# Patient Record
Sex: Female | Born: 1955 | Race: White | Hispanic: No | Marital: Married | Smoking: Never smoker
Health system: Southern US, Community
[De-identification: ages and names within clinical notes are randomized; demographics above are authoritative.]

## PROBLEM LIST (undated history)

## (undated) DIAGNOSIS — N840 Polyp of corpus uteri: Secondary | ICD-10-CM

## (undated) DIAGNOSIS — M1990 Unspecified osteoarthritis, unspecified site: Secondary | ICD-10-CM

## (undated) DIAGNOSIS — N649 Disorder of breast, unspecified: Secondary | ICD-10-CM

## (undated) DIAGNOSIS — D259 Leiomyoma of uterus, unspecified: Secondary | ICD-10-CM

## (undated) DIAGNOSIS — K219 Gastro-esophageal reflux disease without esophagitis: Secondary | ICD-10-CM

## (undated) HISTORY — PX: OTHER SURGICAL HISTORY: SHX169

## (undated) HISTORY — DX: Unspecified osteoarthritis, unspecified site: M19.90

## (undated) HISTORY — DX: Polyp of corpus uteri: N84.0

## (undated) HISTORY — PX: COLONOSCOPY: SHX174

## (undated) HISTORY — DX: Disorder of breast, unspecified: N64.9

## (undated) HISTORY — DX: Gastro-esophageal reflux disease without esophagitis: K21.9

## (undated) HISTORY — PX: HYSTEROSCOPY: SHX211

## (undated) HISTORY — PX: BREAST BIOPSY: SHX20

## (undated) HISTORY — DX: Leiomyoma of uterus, unspecified: D25.9

## (undated) HISTORY — PX: BREAST EXCISIONAL BIOPSY: SUR124

---

## 1991-03-08 HISTORY — PX: BREAST EXCISIONAL BIOPSY: SUR124

## 1996-03-07 HISTORY — PX: BREAST EXCISIONAL BIOPSY: SUR124

## 2003-03-08 HISTORY — PX: BREAST EXCISIONAL BIOPSY: SUR124

## 2014-03-17 ENCOUNTER — Other Ambulatory Visit: Payer: Self-pay | Admitting: Obstetrics & Gynecology

## 2014-03-17 DIAGNOSIS — Z1231 Encounter for screening mammogram for malignant neoplasm of breast: Secondary | ICD-10-CM

## 2014-04-02 ENCOUNTER — Ambulatory Visit (INDEPENDENT_AMBULATORY_CARE_PROVIDER_SITE_OTHER): Payer: BC Managed Care – PPO

## 2014-04-02 DIAGNOSIS — Z1231 Encounter for screening mammogram for malignant neoplasm of breast: Secondary | ICD-10-CM

## 2014-06-24 ENCOUNTER — Ambulatory Visit (INDEPENDENT_AMBULATORY_CARE_PROVIDER_SITE_OTHER): Payer: BC Managed Care – PPO | Admitting: Physician Assistant

## 2014-06-24 ENCOUNTER — Encounter: Payer: Self-pay | Admitting: Physician Assistant

## 2014-06-24 VITALS — BP 123/86 | HR 74 | Ht 64.0 in | Wt 135.0 lb

## 2014-06-24 DIAGNOSIS — Z23 Encounter for immunization: Secondary | ICD-10-CM

## 2014-06-24 DIAGNOSIS — R202 Paresthesia of skin: Secondary | ICD-10-CM

## 2014-06-24 DIAGNOSIS — D241 Benign neoplasm of right breast: Secondary | ICD-10-CM

## 2014-06-24 DIAGNOSIS — Z13220 Encounter for screening for lipoid disorders: Secondary | ICD-10-CM

## 2014-06-24 DIAGNOSIS — R928 Other abnormal and inconclusive findings on diagnostic imaging of breast: Secondary | ICD-10-CM | POA: Diagnosis not present

## 2014-06-24 DIAGNOSIS — Z131 Encounter for screening for diabetes mellitus: Secondary | ICD-10-CM

## 2014-06-24 DIAGNOSIS — Z86010 Personal history of colonic polyps: Secondary | ICD-10-CM

## 2014-06-24 DIAGNOSIS — D242 Benign neoplasm of left breast: Secondary | ICD-10-CM

## 2014-06-24 DIAGNOSIS — R200 Anesthesia of skin: Secondary | ICD-10-CM | POA: Insufficient documentation

## 2014-06-24 MED ORDER — TETANUS-DIPHTH-ACELL PERTUSSIS 5-2.5-18.5 LF-MCG/0.5 IM SUSP: 0.5000 mL | Freq: Once | INTRAMUSCULAR | Status: DC

## 2014-06-24 MED ORDER — DICLOFENAC SODIUM 2 % TD SOLN: TRANSDERMAL | Status: DC

## 2014-06-24 MED ORDER — VARICELLA VIRUS VACCINE LIVE 1350 PFU/0.5ML IJ SUSR: 0.5000 mL | Freq: Once | INTRAMUSCULAR | Status: DC

## 2014-07-01 DIAGNOSIS — D241 Benign neoplasm of right breast: Secondary | ICD-10-CM | POA: Insufficient documentation

## 2014-07-01 DIAGNOSIS — D242 Benign neoplasm of left breast: Secondary | ICD-10-CM

## 2014-07-01 NOTE — Progress Notes (Signed)
   Subjective:    Patient ID: Katie Walker, female    DOB: 02-23-1956, 59 y.o.   MRN: [id number]  HPI Patient is a 59 year old female who presents to the clinic to establish care.  .. Active Ambulatory Problems    Diagnosis Date Noted  . Abnormal mammogram of both breasts 06/24/2014  . History of colonic polyps 06/24/2014  . Numbness and tingling sensation of skin 06/24/2014  . 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 No Resolved Ambulatory Problems   No Additional Past Medical History   .[name] Family History  Problem Relation Age of Onset  . Thyroid disease Sister   . Breast cancer Paternal Grandmother    .[name] History   Social History  . Marital Status: Married    Spouse Name: Ray "Tax inspector  . Number of Children: 2  . Years of Education: Bachelor's   Occupational History  . Not on file.   Social History Main Topics  . Smoking status: Never Smoker   . Smokeless tobacco: Not on file  . Alcohol Use: No  . Drug Use: No  . Sexual Activity: Yes    Birth Control/ Protection: None     Comment: Partner: Male   Other Topics Concern  . Not on file   Social History Narrative      Review of Systems  All other systems reviewed and are negative.      Objective:   Physical Exam  Constitutional: She is oriented to person, place, and time. She appears well-developed and well-nourished.  HENT:  Head: Normocephalic and atraumatic.  Cardiovascular: Normal rate, regular rhythm and normal heart sounds.   Pulmonary/Chest: Effort normal and breath sounds normal.  Musculoskeletal:  NROM of right hip.  No tenderness over right inguinal area.  Strength of right lower extremity 5/5.   Neurological: She is alert and oriented to person, place, and time.  Skin: Skin is dry.  Psychiatric: She has a normal mood and affect. Her behavior is normal.          Assessment & Plan:  Patient was given tetanus shot and  Zostavax today. Patient's mammogram is up-to-date as well as colonoscopy.  Fasting labs were given for patient to have drawn such as lipid panel and CMP.  Patient is having some numbness and tingling in her right growing. Did give samples of pin said to try. Since she is not having any pain or decreased range of motion will not do any further workup at this time. If any of these symptoms change please follow-up.

## 2014-07-11 ENCOUNTER — Encounter: Payer: Self-pay | Admitting: Family Medicine

## 2014-07-11 ENCOUNTER — Ambulatory Visit (INDEPENDENT_AMBULATORY_CARE_PROVIDER_SITE_OTHER): Payer: BC Managed Care – PPO | Admitting: Family Medicine

## 2014-07-11 VITALS — BP 126/77 | HR 74 | Temp 98.3°F | Wt 136.0 lb

## 2014-07-11 DIAGNOSIS — R300 Dysuria: Secondary | ICD-10-CM | POA: Diagnosis not present

## 2014-07-11 LAB — POCT URINALYSIS DIPSTICK
Bilirubin, UA: NEGATIVE
GLUCOSE UA: NEGATIVE
Ketones, UA: NEGATIVE
Nitrite, UA: NEGATIVE
Spec Grav, UA: 1.025
UROBILINOGEN UA: 0.2
pH, UA: 6

## 2014-07-11 MED ORDER — CIPROFLOXACIN HCL 250 MG PO TABS: ORAL_TABLET | ORAL | Status: AC

## 2014-07-11 NOTE — Progress Notes (Signed)
CC: Katie Walker is a 59 y.o. female is here for Urinary Tract Infection   Subjective: HPI:   sensation of incomplete voiding, dysuria, urinary hesitancy that has been present for the past 3 or 4 days on a daily basis. Seems to be worse at night. Severe in severity at night but absent during the day. Accompanied by gross blood in urine last night. Interventions have included increasing fluid intake. Subjective fevers but no chills or night sweats. Denies flank pain but has had suprapubic pain. No confusion, flank pain, nor abdominal pain elsewhere   Review Of Systems Outlined In HPI  No past medical history on file.  No past surgical history on file. Family History  Problem Relation Age of Onset  . Thyroid disease Sister   . Breast cancer Paternal Grandmother     History   Social History  . Marital Status: Married    Spouse Name: Katie Walker "Tax inspector  . Number of Children: 2  . Years of Education: Bachelor's   Occupational History  . Not on file.   Social History Main Topics  . Smoking status: Never Smoker   . Smokeless tobacco: Not on file  . Alcohol Use: No  . Drug Use: No  . Sexual Activity: Yes    Birth Control/ Protection: None     Comment: Partner: Male   Other Topics Concern  . Not on file   Social History Narrative     Objective: BP 126/77 mmHg  Pulse 74  Temp(Src) 98.3 F (36.8 C) (Oral)  Wt 136 lb (61.689 kg)  General: Alert and Oriented, No Acute Distress HEENT: Pupils equal, round, reactive to light. Conjunctivae clear.   Lungs: clear comfortable work of breathing Cardiac: Regular rate and rhythm.  Abdomen: Normal bowel sounds, soft and non tender without palpable masses. Extremities: No peripheral edema.  Strong peripheral pulses.  Mental Status: No depression, anxiety, nor agitation. Skin: Warm and dry.  Assessment & Plan: [name] was seen today for urinary tract infection.  Diagnoses and all orders for this visit:  Dysuria Orders: -      Urinalysis Dipstick -     Urine Culture -     ciprofloxacin (CIPRO) 250 MG tablet; Take one by mouth twice a day for five days.   Dysuria with symptoms suggestive of UTI and suspicious UA therefore start Cipro will follow culture.  Return if symptoms worsen or fail to improve.

## 2014-07-12 LAB — URINE CULTURE: Colony Count: 6000

## 2014-08-11 ENCOUNTER — Ambulatory Visit (INDEPENDENT_AMBULATORY_CARE_PROVIDER_SITE_OTHER): Payer: BC Managed Care – PPO | Admitting: Family Medicine

## 2014-08-11 ENCOUNTER — Encounter: Payer: Self-pay | Admitting: Family Medicine

## 2014-08-11 VITALS — BP 138/86 | HR 75 | Temp 98.4°F | Wt 136.0 lb

## 2014-08-11 DIAGNOSIS — J329 Chronic sinusitis, unspecified: Secondary | ICD-10-CM | POA: Diagnosis not present

## 2014-08-11 DIAGNOSIS — A499 Bacterial infection, unspecified: Secondary | ICD-10-CM | POA: Diagnosis not present

## 2014-08-11 DIAGNOSIS — B9689 Other specified bacterial agents as the cause of diseases classified elsewhere: Secondary | ICD-10-CM

## 2014-08-11 MED ORDER — CEFDINIR 300 MG PO CAPS: 300.0000 mg | ORAL_CAPSULE | Freq: Two times a day (BID) | ORAL | Status: AC

## 2014-08-11 MED ORDER — METHYLPREDNISOLONE ACETATE 80 MG/ML IJ SUSP
80.0000 mg | Freq: Once | INTRAMUSCULAR | Status: AC
  Administered 2014-08-11: 80 mg via INTRAMUSCULAR

## 2014-08-11 NOTE — Addendum Note (Signed)
Addended by: Terance Hart on: 08/11/2014 05:05 PM   Modules accepted: Orders

## 2014-08-11 NOTE — Progress Notes (Signed)
CC: Katie Walker is a 59 y.o. female is here for Sinusitis?   Subjective: HPI:  Nasal congestion, left cheek pain, postnasal drip and sore throat that has been present for the last 10 days. She has tried subsequent sinus, Mucinex, NyQuil, cough drops all of which have been mildly affected. Symptoms are moderate in severity and slowly worsening. Accompanied by subjective fever. Denies shortness of breath, wheezing, chest pain, headache, rash, confusion. Symptoms are present all hours of the day   Review Of Systems Outlined In HPI  No past medical history on file.  No past surgical history on file. Family History  Problem Relation Age of Onset  . Thyroid disease Sister   . Breast cancer Paternal Grandmother     History   Social History  . Marital Status: Married    Spouse Name: Katie Walker "Tax inspector  . Number of Children: 2  . Years of Education: Bachelor's   Occupational History  . Not on file.   Social History Main Topics  . Smoking status: Never Smoker   . Smokeless tobacco: Not on file  . Alcohol Use: No  . Drug Use: No  . Sexual Activity: Yes    Birth Control/ Protection: None     Comment: Partner: Male   Other Topics Concern  . Not on file   Social History Narrative     Objective: BP 138/86 mmHg  Pulse 75  Temp(Src) 98.4 F (36.9 C) (Oral)  Wt 136 lb (61.689 kg)  General: Alert and Oriented, No Acute Distress HEENT: Pupils equal, round, reactive to light. Conjunctivae clear.  External ears unremarkable, canals clear with intact TMs with appropriate landmarks. right Middle ear appears open without effusion, left middle ear has a purulent effusion. Pink inferior turbinates.  Moist mucous membranes, pharynx without inflammation nor lesions.  Neck supple without palpable lymphadenopathy nor abnormal masses.left maxillary sinus tenderness Lungs: Clear to auscultation bilaterally, no wheezing/ronchi/rales.  Comfortable work of breathing. Good air  movement. Extremities: No peripheral edema.  Strong peripheral pulses.  Mental Status: No depression, anxiety, nor agitation. Skin: Warm and dry.  Assessment & Plan: Katie Walker was seen today for sinusitis?.  Diagnoses and all orders for this visit:  Bacterial sinusitis Orders: -     cefdinir (OMNICEF) 300 MG capsule; Take 1 capsule (300 mg total) by mouth 2 (two) times daily.   Bacterial sinusitis: Start Omnicef, she is requesting a injection of "anything that'll help me feel better faster" therefore she'll receive Depo-Medrol since all over-the-counter interventions have been overall ineffective.  Return if symptoms worsen or fail to improve.

## 2014-08-12 ENCOUNTER — Ambulatory Visit: Payer: BC Managed Care – PPO | Admitting: Sports Medicine

## 2014-11-12 ENCOUNTER — Ambulatory Visit (INDEPENDENT_AMBULATORY_CARE_PROVIDER_SITE_OTHER): Payer: BC Managed Care – PPO

## 2014-11-12 ENCOUNTER — Encounter: Payer: Self-pay | Admitting: Obstetrics & Gynecology

## 2014-11-12 ENCOUNTER — Ambulatory Visit (INDEPENDENT_AMBULATORY_CARE_PROVIDER_SITE_OTHER): Payer: BC Managed Care – PPO | Admitting: Obstetrics & Gynecology

## 2014-11-12 VITALS — BP 131/72 | HR 60 | Resp 16 | Ht 64.0 in | Wt 137.0 lb

## 2014-11-12 DIAGNOSIS — Z124 Encounter for screening for malignant neoplasm of cervix: Secondary | ICD-10-CM | POA: Diagnosis not present

## 2014-11-12 DIAGNOSIS — Z01419 Encounter for gynecological examination (general) (routine) without abnormal findings: Secondary | ICD-10-CM

## 2014-11-12 DIAGNOSIS — D259 Leiomyoma of uterus, unspecified: Secondary | ICD-10-CM

## 2014-11-12 DIAGNOSIS — Z0000 Encounter for general adult medical examination without abnormal findings: Secondary | ICD-10-CM

## 2014-11-12 DIAGNOSIS — Z23 Encounter for immunization: Secondary | ICD-10-CM

## 2014-11-12 DIAGNOSIS — Z1151 Encounter for screening for human papillomavirus (HPV): Secondary | ICD-10-CM

## 2014-11-12 LAB — CBC
HCT: 40.4 % (ref 36.0–46.0)
Hemoglobin: 13.1 g/dL (ref 12.0–15.0)
MCH: 28.1 pg (ref 26.0–34.0)
MCHC: 32.4 g/dL (ref 30.0–36.0)
MCV: 86.5 fL (ref 78.0–100.0)
MPV: 9.9 fL (ref 8.6–12.4)
PLATELETS: 209 10*3/uL (ref 150–400)
RBC: 4.67 MIL/uL (ref 3.87–5.11)
RDW: 14.8 % (ref 11.5–15.5)
WBC: 4.7 10*3/uL (ref 4.0–10.5)

## 2014-11-12 LAB — LIPID PANEL
CHOL/HDL RATIO: 2.7 ratio (ref <=5.0)
Cholesterol: 228 mg/dL — ABNORMAL HIGH (ref 125–200)
HDL: 84 mg/dL (ref >=46)
LDL Cholesterol: 122 mg/dL (ref <130)
Triglycerides: 108 mg/dL (ref <150)
VLDL: 22 mg/dL (ref <30)

## 2014-11-12 LAB — COMPREHENSIVE METABOLIC PANEL
ALT: 15 U/L (ref 6–29)
AST: 17 U/L (ref 10–35)
Albumin: 4.5 g/dL (ref 3.6–5.1)
Alkaline Phosphatase: 73 U/L (ref 33–130)
BUN: 25 mg/dL (ref 7–25)
CHLORIDE: 100 mmol/L (ref 98–110)
CO2: 29 mmol/L (ref 20–31)
CREATININE: 0.75 mg/dL (ref 0.50–0.99)
Calcium: 9.4 mg/dL (ref 8.6–10.4)
Glucose, Bld: 83 mg/dL (ref 65–99)
Potassium: 4.1 mmol/L (ref 3.5–5.3)
Sodium: 138 mmol/L (ref 135–146)
Total Bilirubin: 0.5 mg/dL (ref 0.2–1.2)
Total Protein: 6.9 g/dL (ref 6.1–8.1)

## 2014-11-12 LAB — TSH: TSH: 1.583 u[IU]/mL (ref 0.350–4.500)

## 2014-11-12 MED ORDER — TRETINOIN 0.05 % EX CREA: TOPICAL_CREAM | Freq: Every day | CUTANEOUS | Status: DC

## 2014-11-12 NOTE — Progress Notes (Signed)
Subjective:    Katie Walker is a 59 y.o. MW G2 (53 and 51 yo daughters, 2 1/2 grands) female who presents for an annual exam. The patient has no complaints today. She would fasting labs.  The patient is sexually active. GYN screening history: last pap: was normal. The patient wears seatbelts: yes. The patient participates in regular exercise: yes. Has the patient ever been transfused or tattooed?: no. The patient reports that there is not domestic violence in her life.   Menstrual History: OB History    Gravida Para Term Preterm AB TAB SAB Ectopic Multiple Living   2 2 2       2       Menarche age: 42  No LMP recorded. Patient is postmenopausal.    The following portions of the patient's history were reviewed and updated as appropriate: allergies, current medications, past family history, past medical history, past social history, past surgical history and problem list.  Review of Systems A comprehensive review of systems was negative.  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 Colonoscopy UTD (on 5 year cycle).   Objective:    BP 131/72 mmHg  Pulse 60  Resp 16  Ht 5\' 4"  (1.626 m)  Wt 137 lb (62.143 kg)  BMI 23.50 kg/m2  General Appearance:    Alert, cooperative, no distress, appears stated age  Head:    Normocephalic, without obvious abnormality, atraumatic  Eyes:    PERRL, conjunctiva/corneas clear, EOM's intact, fundi    benign, both eyes  Ears:    Normal TM's and external ear canals, both ears  Nose:   Nares normal, septum midline, mucosa normal, no drainage    or sinus tenderness  Throat:   Lips, mucosa, and tongue normal; teeth and gums normal  Neck:   Supple, symmetrical, trachea midline, no adenopathy;    thyroid:  no enlargement/tenderness/nodules; no carotid   bruit or JVD  Back:     Symmetric, no curvature, ROM normal, no CVA tenderness  Lungs:     Clear to auscultation bilaterally, respirations unlabored  Chest Wall:    No tenderness or  deformity   Heart:    Regular rate and rhythm, S1 and S2 normal, no murmur, rub   or gallop  Breast Exam:    No tenderness, masses, or nipple abnormality  Abdomen:     Soft, non-tender, bowel sounds active all four quadrants,    no masses, no organomegaly  Genitalia:    Normal female without lesion, discharge or tenderness, moderate atrophy, NSSA, NT, mobile, normal adnexal exam     Extremities:   Extremities normal, atraumatic, no cyanosis or edema  Pulses:   2+ and symmetric all extremities  Skin:   Skin color, texture, turgor normal, no rashes or lesions  Lymph nodes:   Cervical, supraclavicular, and axillary nodes normal  Neurologic:   CNII-XII intact, normal strength, sensation and reflexes    throughout  .    Assessment:    Healthy female exam.   Fibroids- She is requesting an u/s to follow up on these   Plan:     Breast self exam technique reviewed and patient encouraged to perform self-exam monthly. Thin prep Pap smear. fasting labs   Gyn u/s

## 2014-11-14 LAB — CYTOLOGY - PAP

## 2014-11-17 ENCOUNTER — Encounter: Payer: Self-pay | Admitting: Obstetrics & Gynecology

## 2014-11-17 ENCOUNTER — Encounter: Payer: Self-pay | Admitting: *Deleted

## 2014-11-17 ENCOUNTER — Telehealth: Payer: Self-pay | Admitting: *Deleted

## 2014-11-17 NOTE — Telephone Encounter (Signed)
Mailing Low fat diet

## 2014-11-17 NOTE — Telephone Encounter (Signed)
-----   Message from Emily Filbert, MD sent at 11/17/2014  9:36 AM EDT ----- Can you send her a low fat diet please. Thanks

## 2014-11-24 ENCOUNTER — Encounter: Payer: Self-pay | Admitting: Obstetrics & Gynecology

## 2015-01-20 ENCOUNTER — Encounter: Payer: Self-pay | Admitting: Physician Assistant

## 2015-01-20 ENCOUNTER — Ambulatory Visit (INDEPENDENT_AMBULATORY_CARE_PROVIDER_SITE_OTHER): Payer: BC Managed Care – PPO | Admitting: Physician Assistant

## 2015-01-20 VITALS — BP 135/69 | HR 72 | Ht 64.0 in | Wt 137.0 lb

## 2015-01-20 DIAGNOSIS — H109 Unspecified conjunctivitis: Secondary | ICD-10-CM | POA: Diagnosis not present

## 2015-01-20 MED ORDER — POLYMYXIN B-TRIMETHOPRIM 10000-0.1 UNIT/ML-% OP SOLN: 1.0000 [drp] | Freq: Four times a day (QID) | OPHTHALMIC | Status: DC

## 2015-01-20 NOTE — Patient Instructions (Signed)

## 2015-01-20 NOTE — Progress Notes (Signed)
   Subjective:    Patient ID: Katie Walker, female    DOB: [birth year], 59 y.o.   MRN: [id number]  HPI Pt presents to the clinic with a red, tender right eye with watery discharge for 3 days. She denies any fever, chills, sinus pressure, ST or ear pain. No injury to eye. She has been in and out of hospital due to her new grand baby. She has tried warm compresses but redness and discharge keeps worsening.    Review of Systems  All other systems reviewed and are negative.      Objective:   Physical Exam  Constitutional: She appears well-developed and well-nourished.  HENT:  Head: Normocephalic and atraumatic.  Right Ear: External ear normal.  Left Ear: External ear normal.  Nose: Nose normal.  Mouth/Throat: Oropharynx is clear and moist. No oropharyngeal exudate.  TM's clear bilaterally.   Eyes: Right eye exhibits discharge. Left eye exhibits no discharge.  Right conjunctiva injected with watery and thick greenish discharge. Erythema around eyelids and lashes.   Neck: Normal range of motion. Neck supple.  Cardiovascular: Normal rate, regular rhythm and normal heart sounds.   Pulmonary/Chest: Effort normal and breath sounds normal. She has no wheezes.  Lymphadenopathy:    She has no cervical adenopathy.  Psychiatric: She has a normal mood and affect. Her behavior is normal.          Assessment & Plan:  Bacterial conjunctivitis, right eye- treated with polytrim for 7 days. Continue warm compresses. Ibuprofen for pain. Discussed hand washes and getting rid of mascara. HO given. Follow up as needed.

## 2015-02-24 ENCOUNTER — Other Ambulatory Visit (HOSPITAL_COMMUNITY): Payer: Self-pay | Admitting: Obstetrics & Gynecology

## 2015-02-24 DIAGNOSIS — Z1231 Encounter for screening mammogram for malignant neoplasm of breast: Secondary | ICD-10-CM

## 2015-04-08 ENCOUNTER — Ambulatory Visit: Payer: BC Managed Care – PPO

## 2015-04-15 ENCOUNTER — Ambulatory Visit (INDEPENDENT_AMBULATORY_CARE_PROVIDER_SITE_OTHER): Payer: BC Managed Care – PPO

## 2015-04-15 DIAGNOSIS — Z1231 Encounter for screening mammogram for malignant neoplasm of breast: Secondary | ICD-10-CM | POA: Diagnosis not present

## 2015-11-16 ENCOUNTER — Telehealth: Payer: Self-pay | Admitting: Physician Assistant

## 2015-11-16 DIAGNOSIS — Z0000 Encounter for general adult medical examination without abnormal findings: Secondary | ICD-10-CM

## 2015-11-16 DIAGNOSIS — Z13220 Encounter for screening for lipoid disorders: Secondary | ICD-10-CM

## 2015-11-16 NOTE — Telephone Encounter (Signed)
Labs ordered. Pt notified.

## 2015-11-16 NOTE — Telephone Encounter (Signed)
Patient called and asked that you please send her lab orders downstairs for her upcoming physical next week so she can come in and have the blood work drawn. - CF

## 2015-11-18 LAB — COMPLETE METABOLIC PANEL WITH GFR
ALT: 15 U/L (ref 6–29)
AST: 17 U/L (ref 10–35)
Albumin: 4.4 g/dL (ref 3.6–5.1)
Alkaline Phosphatase: 71 U/L (ref 33–130)
BILIRUBIN TOTAL: 0.6 mg/dL (ref 0.2–1.2)
BUN: 18 mg/dL (ref 7–25)
CALCIUM: 9.8 mg/dL (ref 8.6–10.4)
CHLORIDE: 105 mmol/L (ref 98–110)
CO2: 31 mmol/L (ref 20–31)
CREATININE: 0.9 mg/dL (ref 0.50–0.99)
GFR, Est African American: 80 mL/min (ref >=60)
GFR, Est Non African American: 69 mL/min (ref >=60)
Glucose, Bld: 90 mg/dL (ref 65–99)
Potassium: 4.1 mmol/L (ref 3.5–5.3)
Sodium: 142 mmol/L (ref 135–146)
TOTAL PROTEIN: 6.8 g/dL (ref 6.1–8.1)

## 2015-11-18 LAB — LIPID PANEL
CHOLESTEROL: 215 mg/dL — AB (ref 125–200)
HDL: 80 mg/dL (ref >=46)
LDL CALC: 117 mg/dL (ref <130)
Total CHOL/HDL Ratio: 2.7 Ratio (ref <=5.0)
Triglycerides: 88 mg/dL (ref <150)
VLDL: 18 mg/dL (ref <30)

## 2015-11-23 ENCOUNTER — Ambulatory Visit (INDEPENDENT_AMBULATORY_CARE_PROVIDER_SITE_OTHER): Payer: BC Managed Care – PPO | Admitting: Physician Assistant

## 2015-11-23 ENCOUNTER — Encounter: Payer: Self-pay | Admitting: Physician Assistant

## 2015-11-23 ENCOUNTER — Other Ambulatory Visit (HOSPITAL_COMMUNITY)
Admission: RE | Admit: 2015-11-23 | Discharge: 2015-11-23 | Disposition: A | Discharge status: Home / Self Care | Payer: BC Managed Care – PPO | Source: Ambulatory Visit | Attending: Physician Assistant | Admitting: Physician Assistant

## 2015-11-23 VITALS — BP 131/77 | HR 67 | Ht 64.0 in | Wt 135.0 lb

## 2015-11-23 DIAGNOSIS — Z01419 Encounter for gynecological examination (general) (routine) without abnormal findings: Secondary | ICD-10-CM | POA: Insufficient documentation

## 2015-11-23 DIAGNOSIS — Z1151 Encounter for screening for human papillomavirus (HPV): Secondary | ICD-10-CM | POA: Diagnosis present

## 2015-11-23 DIAGNOSIS — Z0000 Encounter for general adult medical examination without abnormal findings: Secondary | ICD-10-CM | POA: Diagnosis not present

## 2015-11-23 DIAGNOSIS — G479 Sleep disorder, unspecified: Secondary | ICD-10-CM

## 2015-11-23 NOTE — Patient Instructions (Addendum)

## 2015-11-24 ENCOUNTER — Encounter: Payer: Self-pay | Admitting: Physician Assistant

## 2015-11-24 LAB — CYTOLOGY - PAP

## 2015-11-24 NOTE — Progress Notes (Signed)
Subjective:     Katie Walker is a 60 y.o. female and is here for a comprehensive physical exam. The patient reports problems - she continues to have some problems staying asleep. she does take melatonin at bedtime but does not help her stay asleep but helps her get to sleep. .  Social History   Social History  . Marital status: Married    Spouse name: Ray "Tax inspector  . Number of children: 2  . Years of education: Bachelor's   Occupational History  . homemaker    Social History Main Topics  . Smoking status: Never Smoker  . Smokeless tobacco: Never Used  . Alcohol use No  . Drug use: No  . Sexual activity: Yes    Partners: Male    Birth control/ protection: None     Comment: Partner: Male   Other Topics Concern  . Not on file   Social History Narrative  . No narrative on file   Health Maintenance  Topic Date Due  . INFLUENZA VACCINE  11/22/2016 (Originally 10/06/2015)  . Hepatitis C Screening  11/22/2016 (Originally [birth year])  . HIV Screening  11/22/2025 (Originally 06/14/1969)  . MAMMOGRAM  04/14/2017  . PAP SMEAR  11/11/2017  . COLONOSCOPY  08/21/2018  . TETANUS/TDAP  11/11/2024  . ZOSTAVAX  Completed    The following portions of the patient's history were reviewed and updated as appropriate: allergies, current medications, past family history, past medical history, past social history, past surgical history and problem list.  Review of Systems Pertinent items noted in HPI and remainder of comprehensive ROS otherwise negative.   Objective:    BP 131/77   Pulse 67   Ht 5\' 4"  (1.626 m)   Wt 135 lb (61.2 kg)   BMI 23.17 kg/m  General appearance: alert, cooperative and appears stated age Head: Normocephalic, without obvious abnormality, atraumatic Eyes: conjunctivae/corneas clear. PERRL, EOM's intact. Fundi benign. Ears: normal TM's and external ear canals both ears Nose: Nares normal. Septum midline. Mucosa normal. No drainage or sinus  tenderness. Throat: lips, mucosa, and tongue normal; teeth and gums normal Neck: no adenopathy, no carotid bruit, no JVD, supple, symmetrical, trachea midline and thyroid not enlarged, symmetric, no tenderness/mass/nodules Back: symmetric, no curvature. ROM normal. No CVA tenderness. Lungs: clear to auscultation bilaterally Breasts: normal appearance, no masses or tenderness Heart: regular rate and rhythm, S1, S2 normal, no murmur, click, rub or gallop Abdomen: soft, non-tender; bowel sounds normal; no masses,  no organomegaly Pelvic: cervix normal in appearance, external genitalia normal, no adnexal masses or tenderness, no cervical motion tenderness, rectovaginal septum normal, uterus normal size, shape, and consistency and vagina normal without discharge Extremities: extremities normal, atraumatic, no cyanosis or edema Pulses: 2+ and symmetric Skin: Skin color, texture, turgor normal. No rashes or lesions Lymph nodes: Cervical, supraclavicular, and axillary nodes normal. Neurologic: Alert and oriented X 3, normal strength and tone. Normal symmetric reflexes. Normal coordination and gait    Assessment:    Healthy female exam.      Plan:  CPE- discussed labs with patient. Mammogram up to date. Colonoscopy up to date. Flu shot given today without complications. Discussed valarian root for sleep with melatonin. Discussed vitamin D 800 units and calcium 1500mg  with 150 minutes of exercise a week.  See After Visit Summary for Counseling Recommendations

## 2016-03-15 ENCOUNTER — Other Ambulatory Visit (HOSPITAL_COMMUNITY): Payer: Self-pay | Admitting: Obstetrics & Gynecology

## 2016-03-15 DIAGNOSIS — Z9289 Personal history of other medical treatment: Secondary | ICD-10-CM

## 2016-03-21 ENCOUNTER — Telehealth: Payer: Self-pay | Admitting: *Deleted

## 2016-03-21 NOTE — Telephone Encounter (Signed)
[facility] for tamiflu 75mg  1 po qd for 10 days. #10 NRF

## 2016-03-21 NOTE — Telephone Encounter (Signed)
Pt's granddaughter that she keeps daily has tested positive for the flu & pt would like prophylactic Tamiflu called in for her.

## 2016-03-22 ENCOUNTER — Other Ambulatory Visit: Payer: Self-pay | Admitting: *Deleted

## 2016-03-22 MED ORDER — OSELTAMIVIR PHOSPHATE 75 MG PO CAPS: 75.0000 mg | ORAL_CAPSULE | Freq: Every day | ORAL | 0 refills | Status: DC

## 2016-03-22 NOTE — Telephone Encounter (Signed)
Pt notified of rx. 

## 2016-04-19 ENCOUNTER — Ambulatory Visit (INDEPENDENT_AMBULATORY_CARE_PROVIDER_SITE_OTHER): Payer: BC Managed Care – PPO

## 2016-04-19 DIAGNOSIS — Z9289 Personal history of other medical treatment: Secondary | ICD-10-CM

## 2016-04-19 DIAGNOSIS — Z1231 Encounter for screening mammogram for malignant neoplasm of breast: Secondary | ICD-10-CM

## 2016-04-20 ENCOUNTER — Encounter: Payer: Self-pay | Admitting: Physician Assistant

## 2016-04-20 ENCOUNTER — Ambulatory Visit (INDEPENDENT_AMBULATORY_CARE_PROVIDER_SITE_OTHER): Payer: BC Managed Care – PPO | Admitting: Physician Assistant

## 2016-04-20 VITALS — BP 162/77 | HR 87 | Temp 98.5°F | Ht 64.0 in | Wt 138.0 lb

## 2016-04-20 DIAGNOSIS — H6691 Otitis media, unspecified, right ear: Secondary | ICD-10-CM

## 2016-04-20 DIAGNOSIS — J0100 Acute maxillary sinusitis, unspecified: Secondary | ICD-10-CM | POA: Diagnosis not present

## 2016-04-20 MED ORDER — CEFDINIR 300 MG PO CAPS: 300.0000 mg | ORAL_CAPSULE | Freq: Two times a day (BID) | ORAL | 0 refills | Status: DC

## 2016-04-20 MED ORDER — METHYLPREDNISOLONE ACETATE 40 MG/ML IJ SUSP
40.0000 mg | Freq: Once | INTRAMUSCULAR | Status: AC
  Administered 2016-04-20: 40 mg via INTRAMUSCULAR

## 2016-04-20 NOTE — Patient Instructions (Signed)

## 2016-04-20 NOTE — Progress Notes (Addendum)
Subjective:     Patient ID: Katie Walker, female   DOB: 05-13-1955, 61 y.o.   MRN: [id number]  HPI  This is a 61 yo female who presents for congestion, sinus pressure, and cough. States Sunday morning she noticed a mild cough. This morning when she woke up her throat hurt, she had a terrible headache, and noticed ear pain along with some difficulty hearing, rhinorrhea,  "it sounds like I'm under water." She also reports sinus pain and pressure. Denies shortness of breath, chest pain, muscle aches, fatigue, nausea, vomiting, diarrhea. Has taken Zinc, alka seltzer plus, and acetaminophen with minimal relief. Has also taken Nyquil with moderate relief until 4 am this morning. She reports exposure to the flu two weeks ago when her grandson was diagnosed with it. She requests a steroid shot as she usually receives these when she feels this sick.   Review of Systems All other ROS negative except those noted in the HPI.    Objective:   Physical Exam  Constitutional: She is oriented to person, place, and time. She appears well-developed and well-nourished.  HENT:  Head: Normocephalic and atraumatic.  Right Ear: Tympanic membrane is erythematous.  Left Ear: Tympanic membrane is erythematous.  Ears:  Nose: Rhinorrhea present. Right sinus exhibits maxillary sinus tenderness. Left sinus exhibits maxillary sinus tenderness.  Mouth/Throat: Posterior oropharyngeal erythema present. No oropharyngeal exudate or posterior oropharyngeal edema.  Cardiovascular: Normal rate and regular rhythm.  Exam reveals no gallop and no friction rub.   No murmur heard. Pulmonary/Chest: Effort normal and breath sounds normal. No respiratory distress. She has no wheezes. She has no rales.  Abdominal: Soft. Bowel sounds are normal. There is no tenderness.  Neurological: She is alert and oriented to person, place, and time.  Skin: Skin is warm and dry. No rash noted.  Psychiatric: She has a normal mood and affect. Her  behavior is normal.       Assessment/Plan:  Diagnoses and all orders for this visit:  Acute right otitis media -     cefdinir (OMNICEF) 300 MG capsule; Take 1 capsule (300 mg total) by mouth 2 (two) times daily. For 10 days. -     methylPREDNISolone acetate (DEPO-MEDROL) injection 40 mg; Inject 1 mL (40 mg total) into the muscle once.  Acute non-recurrent maxillary sinusitis -     cefdinir (OMNICEF) 300 MG capsule; Take 1 capsule (300 mg total) by mouth 2 (two) times daily. For 10 days. -     methylPREDNISolone acetate (DEPO-MEDROL) injection 40 mg; Inject 1 mL (40 mg total) into the muscle once.   IM Depomedrol administered given failure of over the counter therapy and patient request. Has history of responding well to steroid therapy. Omnicef given for otitis media and probable sinusitis. Patient instructed to use nasal sprays as needed for congestion and continue to rest.

## 2016-04-22 ENCOUNTER — Ambulatory Visit: Payer: Self-pay | Admitting: Physician Assistant

## 2016-04-25 ENCOUNTER — Encounter: Payer: Self-pay | Admitting: Physician Assistant

## 2016-04-26 ENCOUNTER — Other Ambulatory Visit: Payer: Self-pay | Admitting: Physician Assistant

## 2016-04-26 MED ORDER — PREDNISONE 50 MG PO TABS: ORAL_TABLET | ORAL | 0 refills | Status: DC

## 2016-10-11 ENCOUNTER — Telehealth: Payer: Self-pay | Admitting: Physician Assistant

## 2016-10-11 DIAGNOSIS — E7800 Pure hypercholesterolemia, unspecified: Secondary | ICD-10-CM

## 2016-10-11 DIAGNOSIS — Z1329 Encounter for screening for other suspected endocrine disorder: Secondary | ICD-10-CM

## 2016-10-11 NOTE — Telephone Encounter (Signed)
Pt coming in for CPE near the end of September. Would like labs ordered so she can get them done around the 19th. Could you please order those so they are ready to go when she comes in? Thanks!

## 2016-10-14 NOTE — Telephone Encounter (Signed)
Ok for CMP, lipid, TSH

## 2016-10-17 NOTE — Telephone Encounter (Signed)
Ordered fasting labs. Patient can go straight to the lab to have the labs drawn. Left message advising patient that the labs have been ordered.

## 2016-11-23 LAB — COMPLETE METABOLIC PANEL WITH GFR
AG RATIO: 2.1 (calc) (ref 1.0–2.5)
ALT: 12 U/L (ref 6–29)
AST: 15 U/L (ref 10–35)
Albumin: 4.7 g/dL (ref 3.6–5.1)
Alkaline phosphatase (APISO): 71 U/L (ref 33–130)
BUN: 19 mg/dL (ref 7–25)
CALCIUM: 9.4 mg/dL (ref 8.6–10.4)
CO2: 30 mmol/L (ref 20–32)
Chloride: 103 mmol/L (ref 98–110)
Creat: 0.9 mg/dL (ref 0.50–0.99)
GFR, EST AFRICAN AMERICAN: 79 mL/min/{1.73_m2} (ref >=60)
GFR, EST NON AFRICAN AMERICAN: 69 mL/min/{1.73_m2} (ref >=60)
Globulin: 2.2 g/dL (calc) (ref 1.9–3.7)
Glucose, Bld: 90 mg/dL (ref 65–99)
POTASSIUM: 4.1 mmol/L (ref 3.5–5.3)
Sodium: 141 mmol/L (ref 135–146)
TOTAL PROTEIN: 6.9 g/dL (ref 6.1–8.1)
Total Bilirubin: 0.5 mg/dL (ref 0.2–1.2)

## 2016-11-23 LAB — LIPID PANEL W/REFLEX DIRECT LDL
Cholesterol: 227 mg/dL — ABNORMAL HIGH (ref <200)
HDL: 81 mg/dL (ref >50)
LDL CHOLESTEROL (CALC): 126 mg/dL — AB
NON-HDL CHOLESTEROL (CALC): 146 mg/dL — AB (ref <130)
TRIGLYCERIDES: 96 mg/dL (ref <150)
Total CHOL/HDL Ratio: 2.8 (calc) (ref <5.0)

## 2016-11-23 LAB — TSH: TSH: 1.19 m[IU]/L (ref 0.40–4.50)

## 2016-11-29 ENCOUNTER — Encounter: Payer: Self-pay | Admitting: Physician Assistant

## 2016-11-29 ENCOUNTER — Ambulatory Visit (INDEPENDENT_AMBULATORY_CARE_PROVIDER_SITE_OTHER): Payer: BC Managed Care – PPO | Admitting: Physician Assistant

## 2016-11-29 VITALS — BP 135/55 | HR 62 | Ht 64.0 in | Wt 138.0 lb

## 2016-11-29 DIAGNOSIS — Z23 Encounter for immunization: Secondary | ICD-10-CM | POA: Diagnosis not present

## 2016-11-29 DIAGNOSIS — Z0000 Encounter for general adult medical examination without abnormal findings: Secondary | ICD-10-CM | POA: Diagnosis not present

## 2016-11-29 DIAGNOSIS — E7800 Pure hypercholesterolemia, unspecified: Secondary | ICD-10-CM

## 2016-11-29 NOTE — Progress Notes (Signed)
Subjective:     Masiya Claassen is a 61 y.o. female and is here for a comprehensive physical exam. The patient reports problems - see below.    Her sister had thyroid cancer and see is always concerned. She occasionally will have some problems swallowing.  She has been to derm and frozen some SK's.   She has been to opthmology and watching a small cataract.    Social History   Social History  . Marital status: Married    Spouse name: Ray "Tax inspector  . Number of children: 2  . Years of education: Bachelor's   Occupational History  . homemaker    Social History Main Topics  . Smoking status: Never Smoker  . Smokeless tobacco: Never Used  . Alcohol use No  . Drug use: No  . Sexual activity: Yes    Partners: Male    Birth control/ protection: None     Comment: Partner: Male   Other Topics Concern  . Not on file   Social History Narrative  . No narrative on file   Health Maintenance  Topic Date Due  . Hepatitis C Screening  [birth year]  . INFLUENZA VACCINE  11/29/2017 (Originally 10/05/2016)  . HIV Screening  11/22/2025 (Originally 06/14/1969)  . MAMMOGRAM  04/19/2018  . COLONOSCOPY  08/21/2018  . PAP SMEAR  11/23/2018  . TETANUS/TDAP  11/11/2024    The following portions of the patient's history were reviewed and updated as appropriate: allergies, current medications, past family history, past medical history, past social history, past surgical history and problem list.  Review of Systems A comprehensive review of systems was negative.   Objective:    BP (!) 135/55   Pulse 62   Ht 5\' 4"  (1.626 m)   Wt 138 lb (62.6 kg)   BMI 23.69 kg/m  General appearance: alert, cooperative and appears stated age Head: Normocephalic, without obvious abnormality, atraumatic Eyes: conjunctivae/corneas clear. PERRL, EOM's intact. Fundi benign. Ears: normal TM's and external ear canals both ears Nose: Nares normal. Septum midline. Mucosa normal. No drainage or sinus  tenderness. Throat: lips, mucosa, and tongue normal; teeth and gums normal Neck: no adenopathy, no carotid bruit, no JVD, supple, symmetrical, trachea midline and thyroid not enlarged, symmetric, no tenderness/mass/nodules Back: symmetric, no curvature. ROM normal. No CVA tenderness. Lungs: clear to auscultation bilaterally Breasts: normal appearance, no masses or tenderness Heart: regular rate and rhythm, S1, S2 normal, no murmur, click, rub or gallop Abdomen: soft, non-tender; bowel sounds normal; no masses,  no organomegaly Extremities: extremities normal, atraumatic, no cyanosis or edema Pulses: 2+ and symmetric Skin: Skin color, texture, turgor normal. No rashes or lesions Lymph nodes: Cervical, supraclavicular, and axillary nodes normal. Neurologic: Alert and oriented X 3, normal strength and tone. Normal symmetric reflexes. Normal coordination and gait    Assessment:    Healthy female exam.     Plan:     [name] KitchenMarland KitchenMerilee was seen today for annual exam.  Diagnoses and all orders for this visit:  Routine physical examination  Need for shingles vaccine -     Varicella-zoster vaccine IM (Shingrix)     .[name] Depression screen PHQ 2/9 11/29/2016  Decreased Interest 0  Down, Depressed, Hopeless 0  PHQ - 2 Score 0   Come back for next shingles vaccine in 2 to 6 months.   . Discussed 150 minutes of exercise a week.  Encouraged vitamin D 1000 units and Calcium 1300mg  or 4 servings of dairy a day.   Pap/mammogram/colonoscopy up  to date.   Labs done and reviewed together.   Encouraged her to start ASA 81mg  a day and red yeast rice 1200mg  bid.   See After Visit Summary for Counseling Recommendations

## 2016-11-29 NOTE — Patient Instructions (Signed)
Start baby asprin 81mg  daily.  Consider red yeast rice 1200mg   Keeping You Healthy  Get These Tests  Blood Pressure- Have your blood pressure checked by your healthcare provider at least once a year.  Normal blood pressure is 120/80.  Weight- Have your body mass index (BMI) calculated to screen for obesity.  BMI is a measure of body fat based on height and weight.  You can calculate your own BMI at [employer]  Cholesterol- Have your cholesterol checked every year.  Diabetes- Have your blood sugar checked every year if you have high blood pressure, high cholesterol, a family history of diabetes or if you are overweight.  Pap Test - Have a pap test every 1 to 5 years if you have been sexually active.  If you are older than 65 and recent pap tests have been normal you may not need additional pap tests.  In addition, if you have had a hysterectomy  for benign disease additional pap tests are not necessary.  Mammogram-Yearly mammograms are essential for early detection of breast cancer  Screening for Colon Cancer- Colonoscopy starting at age 62. Screening may begin sooner depending on your family history and other health conditions.  Follow up colonoscopy as directed by your Gastroenterologist.  Screening for Osteoporosis- Screening begins at age 55 with bone density scanning, sooner if you are at higher risk for developing Osteoporosis.  Get these medicines  Calcium with Vitamin D- Your body requires 1200-1500 mg of Calcium a day and [phone] IU of Vitamin D a day.  You can only absorb 500 mg of Calcium at a time therefore Calcium must be taken in 2 or 3 separate doses throughout the day.  Hormones- Hormone therapy has been associated with increased risk for certain cancers and heart disease.  Talk to your healthcare provider about if you need relief from menopausal symptoms.  Aspirin- Ask your healthcare provider about taking Aspirin to prevent Heart Disease and  Stroke.  Get these Immuniztions  Flu shot- Every fall  Pneumonia shot- Once after the age of 4; if you are younger ask your healthcare provider if you need a pneumonia shot.  Tetanus- Every ten years.  Zostavax- Once after the age of 49 to prevent shingles.  Take these steps  Don't smoke- Your healthcare provider can help you quit. For tips on how to quit, ask your healthcare provider or go to www.smokefree.gov or call 1-800 QUIT-NOW.  Be physically active- Exercise 5 days a week for a minimum of 30 minutes.  If you are not already physically active, start slow and gradually work up to 30 minutes of moderate physical activity.  Try walking, dancing, bike riding, swimming, etc.  Eat a healthy diet- Eat a variety of healthy foods such as fruits, vegetables, whole grains, low fat milk, low fat cheeses, yogurt, lean meats, chicken, fish, eggs, dried beans, tofu, etc.  For more information go to www.thenutritionsource.org  Dental visit- Brush and floss teeth twice daily; visit your dentist twice a year.  Eye exam- Visit your Optometrist or Ophthalmologist yearly.  Drink alcohol in moderation- Limit alcohol intake to one drink or less a day.  Never drink and drive.  Depression- Your emotional health is as important as your physical health.  If you're feeling down or losing interest in things you normally enjoy, please talk to your healthcare provider.  Seat Belts- can save your life; always wear one  Smoke/Carbon Monoxide detectors- These detectors need to be installed on the appropriate level of your  home.  Replace batteries at least once a year.  Violence- If anyone is threatening or hurting you, please tell your healthcare provider.  Living Will/ Health care power of attorney- Discuss with your healthcare provider and family.

## 2016-12-01 DIAGNOSIS — E7800 Pure hypercholesterolemia, unspecified: Secondary | ICD-10-CM | POA: Insufficient documentation

## 2017-01-30 ENCOUNTER — Ambulatory Visit (INDEPENDENT_AMBULATORY_CARE_PROVIDER_SITE_OTHER): Payer: BC Managed Care – PPO | Admitting: Physician Assistant

## 2017-01-30 VITALS — BP 134/60 | HR 63 | Temp 98.0°F

## 2017-01-30 DIAGNOSIS — Z23 Encounter for immunization: Secondary | ICD-10-CM | POA: Diagnosis not present

## 2017-01-30 NOTE — Progress Notes (Signed)
Pt came into clinic today for second Shingrix vaccine. Pt tolerated first immunization well, no negative side effects. Pt does believe she got a virus the day after and ran a low grade fever. This went away with one dose of tylenol. Pt tolerated injection today in left deltoid well, no immediate complications. Advised of possible side effects. No further questions or concerns.   Agree with above plan. Iran Planas PA-C

## 2017-02-01 ENCOUNTER — Encounter: Payer: Self-pay | Admitting: Physician Assistant

## 2017-03-13 ENCOUNTER — Other Ambulatory Visit (HOSPITAL_COMMUNITY): Payer: Self-pay | Admitting: Obstetrics & Gynecology

## 2017-03-13 DIAGNOSIS — Z1239 Encounter for other screening for malignant neoplasm of breast: Secondary | ICD-10-CM

## 2017-04-20 ENCOUNTER — Ambulatory Visit (INDEPENDENT_AMBULATORY_CARE_PROVIDER_SITE_OTHER): Payer: BC Managed Care – PPO

## 2017-04-20 DIAGNOSIS — Z1231 Encounter for screening mammogram for malignant neoplasm of breast: Secondary | ICD-10-CM

## 2017-04-20 DIAGNOSIS — Z1239 Encounter for other screening for malignant neoplasm of breast: Secondary | ICD-10-CM

## 2017-05-25 ENCOUNTER — Ambulatory Visit: Payer: BC Managed Care – PPO | Admitting: Physician Assistant

## 2017-05-25 ENCOUNTER — Encounter: Payer: Self-pay | Admitting: Physician Assistant

## 2017-05-25 VITALS — BP 127/83 | HR 66 | Temp 98.1°F | Wt 143.0 lb

## 2017-05-25 DIAGNOSIS — J0100 Acute maxillary sinusitis, unspecified: Secondary | ICD-10-CM | POA: Diagnosis not present

## 2017-05-25 MED ORDER — METHYLPREDNISOLONE ACETATE 40 MG/ML IJ SUSP
40.0000 mg | Freq: Once | INTRAMUSCULAR | Status: AC
  Administered 2017-05-25: 40 mg via INTRAMUSCULAR

## 2017-05-25 MED ORDER — CEFUROXIME AXETIL 250 MG PO TABS: 250.0000 mg | ORAL_TABLET | Freq: Two times a day (BID) | ORAL | 0 refills | Status: AC

## 2017-05-25 NOTE — Patient Instructions (Signed)
Sinus Rinse What is a sinus rinse? A sinus rinse is a simple home treatment that is used to rinse your sinuses with a sterile mixture of salt and water (saline solution). Sinuses are air-filled spaces in your skull behind the bones of your face and forehead that open into your nasal cavity. You will use the following:  Saline solution.  Neti pot or spray bottle. This releases the saline solution into your nose and through your sinuses. Neti pots and spray bottles can be purchased at your local pharmacy, a health food store, or online.  When would I do a sinus rinse? A sinus rinse can help to clear mucus, dirt, dust, or pollen from the nasal cavity. You may do a sinus rinse when you have a cold, a virus, nasal allergy symptoms, a sinus infection, or stuffiness in the nose or sinuses. If you are considering a sinus rinse:  Ask your child's health care provider before performing a sinus rinse on your child.  Do not do a sinus rinse if you have had ear or nasal surgery, ear infection, or blocked ears.  How do I do a sinus rinse?  Wash your hands.  Disinfect your device according to the directions provided and then dry it.  Use the solution that comes with your device or one that is sold separately in stores. Follow the mixing directions on the package.  Fill your device with the amount of saline solution as directed by the device instructions.  Stand over a sink and tilt your head sideways over the sink.  Place the spout of the device in your upper nostril (the one closer to the ceiling).  Gently pour or squeeze the saline solution into the nasal cavity. The liquid should drain to the lower nostril if you are not overly congested.  Gently blow your nose. Blowing too hard may cause ear pain.  Repeat in the other nostril.  Clean and rinse your device with clean water and then air-dry it. Are there risks of a sinus rinse? Sinus rinse is generally very safe and effective. However,  there are a few risks, which include:  A burning sensation in the sinuses. This may happen if you do not make the saline solution as directed. Make sure to follow all directions when making the saline solution.  Infection from contaminated water. This is rare, but possible.  Nasal irritation.  This information is not intended to replace advice given to you by your health care provider. Make sure you discuss any questions you have with your health care provider. Document Released: 09/18/2013 Document Revised: 01/19/2016 Document Reviewed: 07/09/2013 Elsevier Interactive Patient Education  2017 [employer].  

## 2017-05-25 NOTE — Progress Notes (Signed)
HPI:                                                                Katie Walker is a 62 y.o. female who presents to [facility] today for sinusitis  Sinusitis  This is a new problem. The current episode started yesterday. The problem has been gradually worsening since onset. There has been no fever. The pain is moderate. Associated symptoms include congestion, ear pain (left) and sinus pressure (left-sided). (+ clear rhinorrhea) Treatments tried: + Nyquil. The treatment provided no relief.  Requesting a steroid injection today.  No flowsheet data found.    Past Medical History:  Diagnosis Date  . Breast disease   . Endometrial polyp   . GERD (gastroesophageal reflux disease)   . Uterine fibroid    Past Surgical History:  Procedure Laterality Date  . breast biopsies     fibroadenoma  . COLONOSCOPY    . HYSTEROSCOPY     Social History   Tobacco Use  . Smoking status: Never Smoker  . Smokeless tobacco: Never Used  Substance Use Topics  . Alcohol use: No    Alcohol/week: 0.0 oz   family history includes Breast cancer in her paternal grandmother; Cancer in her maternal grandmother; Colon cancer in her father and paternal grandfather; Heart disease in her maternal grandfather; Thyroid cancer in her sister.    ROS: negative except as noted in the HPI  Medications: Current Outpatient Medications  Medication Sig Dispense Refill  . aspirin 81 MG tablet     . Calcium Carbonate-Vitamin D (CALCIUM + D PO) Take by mouth.    . cefUROXime (CEFTIN) 250 MG tablet Take 1 tablet (250 mg total) by mouth 2 (two) times daily with a meal for 7 days. 14 tablet 0  . Cinnamon 500 MG capsule Take 2,000 mg by mouth daily.     . Coconut Oil (EQL COCONUT OIL) 1000 MG CAPS Take 1,000 mg by mouth daily.    . Cyanocobalamin (HM SUPER VITAMIN B12 PO) Take by mouth.    . famotidine (PEPCID) 20 MG tablet Take 20 mg by mouth 2 (two) times daily.     . Multiple Vitamin (MULTI VITAMIN DAILY PO) Take by mouth.    . Omega-3 Fatty Acids (OMEGA 3 PO) Take by mouth.    . Red Yeast Rice Extract 600 MG CAPS      No current facility-administered medications for this visit.    No Known Allergies     Objective:  BP 127/83   Pulse 66   Temp 98.1 F (36.7 C) (Oral)   Wt 143 lb (64.9 kg)   BMI 24.55 kg/m  Gen:  alert, not ill-appearing, no distress, appropriate for age 49: head normocephalic without obvious abnormality, conjunctiva and cornea clear, left TM with serous effusion, right TM gray and transparent, oropharynx clear, nasal mucosa edematous, there is left maxillary sinus tenderness, trachea midline Pulm: Normal work of breathing, normal phonation, clear to auscultation bilaterally, no wheezes, rales or rhonchi CV: Normal rate, regular rhythm, s1 and s2 distinct, no murmurs, clicks or rubs  Neuro: alert and oriented x 3, no tremor MSK: extremities atraumatic, normal gait and station Skin: intact, no rashes on exposed skin, no jaundice, no  cyanosis    No results found for this or any previous visit (from the past 72 hour(s)). No results found.    Assessment and Plan: 62 y.o. female with   1. Acute non-recurrent maxillary sinusitis - explained likely viral nature of sinusitis, especially of this short duration. She is very worried that she will get worse when traveling this weekend for a wedding.  - counseled on nasal irrigation and symptomatic care. Start antibiotic if developing purulent drainage, fever, facial swelling.  - cefUROXime (CEFTIN) 250 MG tablet; Take 1 tablet (250 mg total) by mouth 2 (two) times daily with a meal for 7 days.  Dispense: 14 tablet; Refill: 0   Patient education and anticipatory guidance given Patient agrees with treatment plan Follow-up as needed if symptoms worsen or fail to improve  Darlyne Russian PA-C

## 2017-08-18 DIAGNOSIS — H02834 Dermatochalasis of left upper eyelid: Secondary | ICD-10-CM

## 2017-08-18 DIAGNOSIS — H02831 Dermatochalasis of right upper eyelid: Secondary | ICD-10-CM | POA: Insufficient documentation

## 2017-08-18 DIAGNOSIS — H269 Unspecified cataract: Secondary | ICD-10-CM | POA: Insufficient documentation

## 2017-08-18 DIAGNOSIS — H43813 Vitreous degeneration, bilateral: Secondary | ICD-10-CM | POA: Insufficient documentation

## 2017-08-18 DIAGNOSIS — H527 Unspecified disorder of refraction: Secondary | ICD-10-CM | POA: Insufficient documentation

## 2017-08-18 HISTORY — DX: Unspecified cataract: H26.9

## 2017-11-28 ENCOUNTER — Telehealth: Payer: Self-pay

## 2017-11-28 DIAGNOSIS — Z131 Encounter for screening for diabetes mellitus: Secondary | ICD-10-CM

## 2017-11-28 DIAGNOSIS — Z13220 Encounter for screening for lipoid disorders: Secondary | ICD-10-CM

## 2017-11-28 DIAGNOSIS — Z0000 Encounter for general adult medical examination without abnormal findings: Secondary | ICD-10-CM

## 2017-11-28 NOTE — Telephone Encounter (Signed)
Reader printed. Please fax to the labs.

## 2017-11-28 NOTE — Telephone Encounter (Signed)
Done

## 2017-11-28 NOTE — Telephone Encounter (Signed)
Katie Walker called this morning and was wanting to see if you could go ahead and order her labs so she can have those done before her appointment for her physical on Thursday 11/30/2017. Thanks!

## 2017-11-30 ENCOUNTER — Encounter: Payer: Self-pay | Admitting: Physician Assistant

## 2017-11-30 ENCOUNTER — Ambulatory Visit (INDEPENDENT_AMBULATORY_CARE_PROVIDER_SITE_OTHER): Payer: BC Managed Care – PPO | Admitting: Physician Assistant

## 2017-11-30 VITALS — BP 121/67 | HR 67 | Ht 64.0 in | Wt 136.0 lb

## 2017-11-30 DIAGNOSIS — Z0000 Encounter for general adult medical examination without abnormal findings: Secondary | ICD-10-CM

## 2017-11-30 DIAGNOSIS — Z86010 Personal history of colonic polyps: Secondary | ICD-10-CM

## 2017-11-30 DIAGNOSIS — R1310 Dysphagia, unspecified: Secondary | ICD-10-CM

## 2017-11-30 DIAGNOSIS — E7800 Pure hypercholesterolemia, unspecified: Secondary | ICD-10-CM

## 2017-11-30 NOTE — Progress Notes (Signed)
n Subjective:     Katie Walker is a 62 y.o. female and is here for a comprehensive physical exam. The patient reports problems - for the last year she has been having some progressive problems swallowing or feeling like she is getting choked on liquids. seems to be more with liquids than solid foods. she denies any GERD symptoms. she has never "choked". she just will swallow and then cough for a whiile because it feels like it goes down the wrong tube. .  Social History   Socioeconomic History  . Marital status: Married    Spouse name: Katie Walker "Tax inspector  . Number of children: 2  . Years of education: Bachelor's  . Highest education level: Not on file  Occupational History  . Occupation: homemaker  Social Needs  . Financial resource strain: Not on file  . Food insecurity:    Worry: Not on file    Inability: Not on file  . Transportation needs:    Medical: Not on file    Non-medical: Not on file  Tobacco Use  . Smoking status: Never Smoker  . Smokeless tobacco: Never Used  Substance and Sexual Activity  . Alcohol use: No    Alcohol/week: 0.0 standard drinks  . Drug use: No  . Sexual activity: Yes    Partners: Male    Birth control/protection: None    Comment: Partner: Male  Lifestyle  . Physical activity:    Days per week: Not on file    Minutes per session: Not on file  . Stress: Not on file  Relationships  . Social connections:    Talks on phone: Not on file    Gets together: Not on file    Attends religious service: Not on file    Active member of club or organization: Not on file    Attends meetings of clubs or organizations: Not on file    Relationship status: Not on file  . Intimate partner violence:    Fear of current or ex partner: Not on file    Emotionally abused: Not on file    Physically abused: Not on file    Forced sexual activity: Not on file  Other Topics Concern  . Not on file  Social History Narrative  . Not on file   Health Maintenance   Topic Date Due  . Hepatitis C Screening  [birth year]  . INFLUENZA VACCINE  03/07/2018 (Originally 10/05/2017)  . HIV Screening  11/22/2025 (Originally 06/14/1970)  . COLONOSCOPY  08/21/2018  . PAP SMEAR  11/23/2018  . MAMMOGRAM  04/21/2019  . TETANUS/TDAP  11/11/2024    The following portions of the patient's history were reviewed and updated as appropriate: allergies, current medications, past family history, past medical history, past social history, past surgical history and problem list.  Review of Systems Pertinent items noted in HPI and remainder of comprehensive ROS otherwise negative.   Objective:    BP 121/67   Pulse 67   Ht 5\' 4"  (1.626 m)   Wt 136 lb (61.7 kg)   BMI 23.34 kg/m  General appearance: alert, cooperative and appears stated age Head: Normocephalic, without obvious abnormality, atraumatic Eyes: conjunctivae/corneas clear. PERRL, EOM's intact. Fundi benign. Ears: normal TM's and external ear canals both ears Nose: Nares normal. Septum midline. Mucosa normal. No drainage or sinus tenderness. Throat: lips, mucosa, and tongue normal; teeth and gums normal Neck: no adenopathy, no carotid bruit, no JVD, supple, symmetrical, trachea midline and thyroid not enlarged, symmetric, no tenderness/mass/nodules  Back: symmetric, no curvature. ROM normal. No CVA tenderness. Lungs: clear to auscultation bilaterally Heart: regular rate and rhythm, S1, S2 normal, no murmur, click, rub or gallop Abdomen: soft, non-tender; bowel sounds normal; no masses,  no organomegaly Extremities: extremities normal, atraumatic, no cyanosis or edema Pulses: 2+ and symmetric Skin: Skin color, texture, turgor normal. No rashes or lesions Lymph nodes: Cervical, supraclavicular, and axillary nodes normal. Neurologic: Alert and oriented X 3, normal strength and tone. Normal symmetric reflexes. Normal coordination and gait    Assessment:    Healthy female exam.      Plan:  Katie KitchenMarland Walker was seen  today for annual exam.  Diagnoses and all orders for this visit:  Routine physical examination  Dysphagia, unspecified type   .Katie Kitchen Depression screen Katie Walker 2/9 11/30/2017 11/29/2016  Decreased Interest 0 0  Down, Depressed, Hopeless 0 0  PHQ - 2 Score 0 0  Altered sleeping 1 -  Tired, decreased energy 0 -  Change in appetite 0 -  Feeling bad or failure about yourself  0 -  Trouble concentrating 0 -  Moving slowly or fidgety/restless 0 -  Suicidal thoughts 0 -  PHQ-9 Score 1 -  Difficult doing work/chores Not difficult at all -   .. Discussed 150 minutes of exercise a week.  Encouraged vitamin D 1000 units and Calcium 1300mg  or 4 servings of dairy a day.  Fasting labs ordered.  Mammogram up to date.  Vaccines up to date. shingrix completed.  Colonoscopy due next year due to polyps.   On pepcid and symptoms do not appear like GERd. Sounds like could be come esophageal stenosis. Due to some dysphagia will make GI referral for evaluation.     See After Visit Summary for Counseling Recommendations

## 2017-11-30 NOTE — Patient Instructions (Signed)
Health Maintenance for Postmenopausal Women Menopause is a normal process in which your reproductive ability comes to an end. This process happens gradually over a span of months to years, usually between the ages of 22 and 9. Menopause is complete when you have missed 12 consecutive menstrual periods. It is important to talk with your health care provider about some of the most common conditions that affect postmenopausal women, such as heart disease, cancer, and bone loss (osteoporosis). Adopting a healthy lifestyle and getting preventive care can help to promote your health and wellness. Those actions can also lower your chances of developing some of these common conditions. What should I know about menopause? During menopause, you may experience a number of symptoms, such as:  Moderate-to-severe hot flashes.  Night sweats.  Decrease in sex drive.  Mood swings.  Headaches.  Tiredness.  Irritability.  Memory problems.  Insomnia.  Choosing to treat or not to treat menopausal changes is an individual decision that you make with your health care provider. What should I know about hormone replacement therapy and supplements? Hormone therapy products are effective for treating symptoms that are associated with menopause, such as hot flashes and night sweats. Hormone replacement carries certain risks, especially as you become older. If you are thinking about using estrogen or estrogen with progestin treatments, discuss the benefits and risks with your health care provider. What should I know about heart disease and stroke? Heart disease, heart attack, and stroke become more likely as you age. This may be due, in part, to the hormonal changes that your body experiences during menopause. These can affect how your body processes dietary fats, triglycerides, and cholesterol. Heart attack and stroke are both medical emergencies. There are many things that you can do to help prevent heart disease  and stroke:  Have your blood pressure checked at least every 1-2 years. High blood pressure causes heart disease and increases the risk of stroke.  If you are 53-22 years old, ask your health care provider if you should take aspirin to prevent a heart attack or a stroke.  Do not use any tobacco products, including cigarettes, chewing tobacco, or electronic cigarettes. If you need help quitting, ask your health care provider.  It is important to eat a healthy diet and maintain a healthy weight. ? Be sure to include plenty of vegetables, fruits, low-fat dairy products, and lean protein. ? Avoid eating foods that are high in solid fats, added sugars, or salt (sodium).  Get regular exercise. This is one of the most important things that you can do for your health. ? Try to exercise for at least 150 minutes each week. The type of exercise that you do should increase your heart rate and make you sweat. This is known as moderate-intensity exercise. ? Try to do strengthening exercises at least twice each week. Do these in addition to the moderate-intensity exercise.  Know your numbers.Ask your health care provider to check your cholesterol and your blood glucose. Continue to have your blood tested as directed by your health care provider.  What should I know about cancer screening? There are several types of cancer. Take the following steps to reduce your risk and to catch any cancer development as early as possible. Breast Cancer  Practice breast self-awareness. ? This means understanding how your breasts normally appear and feel. ? It also means doing regular breast self-exams. Let your health care provider know about any changes, no matter how small.  If you are 40  or older, have a clinician do a breast exam (clinical breast exam or CBE) every year. Depending on your age, family history, and medical history, it may be recommended that you also have a yearly breast X-ray (mammogram).  If you  have a family history of breast cancer, talk with your health care provider about genetic screening.  If you are at high risk for breast cancer, talk with your health care provider about having an MRI and a mammogram every year.  Breast cancer (BRCA) gene test is recommended for women who have family members with BRCA-related cancers. Results of the assessment will determine the need for genetic counseling and BRCA1 and for BRCA2 testing. BRCA-related cancers include these types: ? Breast. This occurs in males or females. ? Ovarian. ? Tubal. This may also be called fallopian tube cancer. ? Cancer of the abdominal or pelvic lining (peritoneal cancer). ? Prostate. ? Pancreatic.  Cervical, Uterine, and Ovarian Cancer Your health care provider may recommend that you be screened regularly for cancer of the pelvic organs. These include your ovaries, uterus, and vagina. This screening involves a pelvic exam, which includes checking for microscopic changes to the surface of your cervix (Pap test).  For women ages 21-65, health care providers may recommend a pelvic exam and a Pap test every three years. For women ages 79-65, they may recommend the Pap test and pelvic exam, combined with testing for human papilloma virus (HPV), every five years. Some types of HPV increase your risk of cervical cancer. Testing for HPV may also be done on women of any age who have unclear Pap test results.  Other health care providers may not recommend any screening for nonpregnant women who are considered low risk for pelvic cancer and have no symptoms. Ask your health care provider if a screening pelvic exam is right for you.  If you have had past treatment for cervical cancer or a condition that could lead to cancer, you need Pap tests and screening for cancer for at least 20 years after your treatment. If Pap tests have been discontinued for you, your risk factors (such as having a new sexual partner) need to be  reassessed to determine if you should start having screenings again. Some women have medical problems that increase the chance of getting cervical cancer. In these cases, your health care provider may recommend that you have screening and Pap tests more often.  If you have a family history of uterine cancer or ovarian cancer, talk with your health care provider about genetic screening.  If you have vaginal bleeding after reaching menopause, tell your health care provider.  There are currently no reliable tests available to screen for ovarian cancer.  Lung Cancer Lung cancer screening is recommended for adults 69-62 years old who are at high risk for lung cancer because of a history of smoking. A yearly low-dose CT scan of the lungs is recommended if you:  Currently smoke.  Have a history of at least 30 pack-years of smoking and you currently smoke or have quit within the past 15 years. A pack-year is smoking an average of one pack of cigarettes per day for one year.  Yearly screening should:  Continue until it has been 15 years since you quit.  Stop if you develop a health problem that would prevent you from having lung cancer treatment.  Colorectal Cancer  This type of cancer can be detected and can often be prevented.  Routine colorectal cancer screening usually begins at  age 42 and continues through age 45.  If you have risk factors for colon cancer, your health care provider may recommend that you be screened at an earlier age.  If you have a family history of colorectal cancer, talk with your health care provider about genetic screening.  Your health care provider may also recommend using home test kits to check for hidden blood in your stool.  A small camera at the end of a tube can be used to examine your colon directly (sigmoidoscopy or colonoscopy). This is done to check for the earliest forms of colorectal cancer.  Direct examination of the colon should be repeated every  5-10 years until age 71. However, if early forms of precancerous polyps or small growths are found or if you have a family history or genetic risk for colorectal cancer, you may need to be screened more often.  Skin Cancer  Check your skin from head to toe regularly.  Monitor any moles. Be sure to tell your health care provider: ? About any new moles or changes in moles, especially if there is a change in a mole's shape or color. ? If you have a mole that is larger than the size of a pencil eraser.  If any of your family members has a history of skin cancer, especially at a young age, talk with your health care provider about genetic screening.  Always use sunscreen. Apply sunscreen liberally and repeatedly throughout the day.  Whenever you are outside, protect yourself by wearing long sleeves, pants, a wide-brimmed hat, and sunglasses.  What should I know about osteoporosis? Osteoporosis is a condition in which bone destruction happens more quickly than new bone creation. After menopause, you may be at an increased risk for osteoporosis. To help prevent osteoporosis or the bone fractures that can happen because of osteoporosis, the following is recommended:  If you are 46-71 years old, get at least 1,000 mg of calcium and at least 600 mg of vitamin D per day.  If you are older than age 55 but younger than age 65, get at least 1,200 mg of calcium and at least 600 mg of vitamin D per day.  If you are older than age 54, get at least 1,200 mg of calcium and at least 800 mg of vitamin D per day.  Smoking and excessive alcohol intake increase the risk of osteoporosis. Eat foods that are rich in calcium and vitamin D, and do weight-bearing exercises several times each week as directed by your health care provider. What should I know about how menopause affects my mental health? Depression may occur at any age, but it is more common as you become older. Common symptoms of depression  include:  Low or sad mood.  Changes in sleep patterns.  Changes in appetite or eating patterns.  Feeling an overall lack of motivation or enjoyment of activities that you previously enjoyed.  Frequent crying spells.  Talk with your health care provider if you think that you are experiencing depression. What should I know about immunizations? It is important that you get and maintain your immunizations. These include:  Tetanus, diphtheria, and pertussis (Tdap) booster vaccine.  Influenza every year before the flu season begins.  Pneumonia vaccine.  Shingles vaccine.  Your health care provider may also recommend other immunizations. This information is not intended to replace advice given to you by your health care provider. Make sure you discuss any questions you have with your health care provider. Document Released: 04/15/2005  Document Revised: 09/11/2015 Document Reviewed: 11/25/2014 Elsevier Interactive Patient Education  2018 Elsevier Inc.  

## 2017-12-01 ENCOUNTER — Encounter: Payer: BC Managed Care – PPO | Admitting: Physician Assistant

## 2017-12-01 NOTE — Telephone Encounter (Signed)
Call pt: good news is your HDL is still in a great range. Bad news is no real improvement with LDL. 10 year CV risk is 5.5 percent. No indication for statin at this time. Since no improvement in a year you can stop red yeast rice. Continue to exercise and eat balanced diet.  Thyroid perfect.  Kidney, liver, glucose look great.  Overall really good labs.

## 2017-12-04 ENCOUNTER — Telehealth: Payer: Self-pay | Admitting: Physician Assistant

## 2017-12-04 DIAGNOSIS — R1310 Dysphagia, unspecified: Secondary | ICD-10-CM | POA: Insufficient documentation

## 2017-12-04 NOTE — Telephone Encounter (Signed)
Pt recently had labs. Can we call and add hep C screening?

## 2017-12-05 NOTE — Telephone Encounter (Signed)
Spoke with Juliann Pulse at [facility], lab has been added

## 2017-12-05 NOTE — Telephone Encounter (Signed)
Thanks

## 2017-12-06 LAB — COMPLETE METABOLIC PANEL WITH GFR
AG RATIO: 2.2 (calc) (ref 1.0–2.5)
ALT: 11 U/L (ref 6–29)
AST: 16 U/L (ref 10–35)
Albumin: 4.7 g/dL (ref 3.6–5.1)
Alkaline phosphatase (APISO): 71 U/L (ref 33–130)
BUN: 25 mg/dL (ref 7–25)
CALCIUM: 10.1 mg/dL (ref 8.6–10.4)
CO2: 29 mmol/L (ref 20–32)
Chloride: 104 mmol/L (ref 98–110)
Creat: 0.82 mg/dL (ref 0.50–0.99)
GFR, EST AFRICAN AMERICAN: 88 mL/min/{1.73_m2} (ref >=60)
GFR, EST NON AFRICAN AMERICAN: 76 mL/min/{1.73_m2} (ref >=60)
GLOBULIN: 2.1 g/dL (ref 1.9–3.7)
Glucose, Bld: 93 mg/dL (ref 65–99)
Potassium: 4.2 mmol/L (ref 3.5–5.3)
Sodium: 141 mmol/L (ref 135–146)
Total Bilirubin: 0.4 mg/dL (ref 0.2–1.2)
Total Protein: 6.8 g/dL (ref 6.1–8.1)

## 2017-12-06 LAB — LIPID PANEL W/REFLEX DIRECT LDL
CHOL/HDL RATIO: 2.9 (calc) (ref <5.0)
CHOLESTEROL: 221 mg/dL — AB (ref <200)
HDL: 77 mg/dL (ref >50)
LDL Cholesterol (Calc): 125 mg/dL (calc) — ABNORMAL HIGH
NON-HDL CHOLESTEROL (CALC): 144 mg/dL — AB (ref <130)
Triglycerides: 88 mg/dL (ref <150)

## 2017-12-06 LAB — TEST AUTHORIZATION

## 2017-12-06 LAB — HEPATITIS C ANTIBODY
Hepatitis C Ab: NONREACTIVE
SIGNAL TO CUT-OFF: 0.02 (ref <1.00)

## 2017-12-06 LAB — TSH: TSH: 1.05 mIU/L (ref 0.40–4.50)

## 2018-03-08 ENCOUNTER — Other Ambulatory Visit: Payer: Self-pay | Admitting: Obstetrics & Gynecology

## 2018-03-08 DIAGNOSIS — Z1231 Encounter for screening mammogram for malignant neoplasm of breast: Secondary | ICD-10-CM

## 2018-04-25 ENCOUNTER — Ambulatory Visit (INDEPENDENT_AMBULATORY_CARE_PROVIDER_SITE_OTHER): Payer: BC Managed Care – PPO

## 2018-04-25 DIAGNOSIS — Z1231 Encounter for screening mammogram for malignant neoplasm of breast: Secondary | ICD-10-CM

## 2018-04-27 NOTE — Progress Notes (Signed)
Call pt: normal mammogram. Follow up in one year.

## 2018-05-28 ENCOUNTER — Encounter: Payer: Self-pay | Admitting: Physician Assistant

## 2018-08-14 ENCOUNTER — Encounter: Payer: Self-pay | Admitting: Physician Assistant

## 2018-09-13 ENCOUNTER — Encounter: Payer: Self-pay | Admitting: Physician Assistant

## 2018-10-01 LAB — HM COLONOSCOPY

## 2019-03-14 ENCOUNTER — Other Ambulatory Visit: Payer: Self-pay | Admitting: Physician Assistant

## 2019-03-14 DIAGNOSIS — Z1231 Encounter for screening mammogram for malignant neoplasm of breast: Secondary | ICD-10-CM

## 2019-04-29 ENCOUNTER — Encounter: Payer: Self-pay | Admitting: Physician Assistant

## 2019-04-29 DIAGNOSIS — Z0000 Encounter for general adult medical examination without abnormal findings: Secondary | ICD-10-CM

## 2019-04-29 DIAGNOSIS — E7800 Pure hypercholesterolemia, unspecified: Secondary | ICD-10-CM

## 2019-04-29 NOTE — Telephone Encounter (Signed)
CBC, CMP, and lipid panel pended. Anything additional?

## 2019-04-29 NOTE — Telephone Encounter (Signed)
Appointment has been made. She would like a full panel to have her physical. She is coming to get them done march 2. Thank you.

## 2019-04-30 NOTE — Telephone Encounter (Signed)
Need her digestive health colonoscopy

## 2019-05-01 ENCOUNTER — Ambulatory Visit (INDEPENDENT_AMBULATORY_CARE_PROVIDER_SITE_OTHER): Payer: BC Managed Care – PPO

## 2019-05-01 ENCOUNTER — Other Ambulatory Visit: Payer: Self-pay

## 2019-05-01 DIAGNOSIS — Z1231 Encounter for screening mammogram for malignant neoplasm of breast: Secondary | ICD-10-CM | POA: Diagnosis not present

## 2019-05-01 NOTE — Telephone Encounter (Signed)
Colonoscopy  requested.

## 2019-05-03 NOTE — Progress Notes (Signed)
Katie Walker,   Normal mammogram. Follow up in 1 year.

## 2019-05-06 LAB — COMPLETE METABOLIC PANEL WITH GFR
AG Ratio: 2 (calc) (ref 1.0–2.5)
ALT: 21 U/L (ref 6–29)
AST: 19 U/L (ref 10–35)
Albumin: 4.7 g/dL (ref 3.6–5.1)
Alkaline phosphatase (APISO): 71 U/L (ref 37–153)
BUN: 19 mg/dL (ref 7–25)
CO2: 30 mmol/L (ref 20–32)
Calcium: 10.2 mg/dL (ref 8.6–10.4)
Chloride: 102 mmol/L (ref 98–110)
Creat: 0.91 mg/dL (ref 0.50–0.99)
GFR, Est African American: 77 mL/min/{1.73_m2} (ref >=60)
GFR, Est Non African American: 67 mL/min/{1.73_m2} (ref >=60)
Globulin: 2.4 g/dL (calc) (ref 1.9–3.7)
Glucose, Bld: 96 mg/dL (ref 65–99)
Potassium: 4.3 mmol/L (ref 3.5–5.3)
Sodium: 141 mmol/L (ref 135–146)
Total Bilirubin: 0.5 mg/dL (ref 0.2–1.2)
Total Protein: 7.1 g/dL (ref 6.1–8.1)

## 2019-05-06 LAB — CBC WITH DIFFERENTIAL/PLATELET
Absolute Monocytes: 330 cells/uL (ref 200–950)
Basophils Absolute: 41 cells/uL (ref 0–200)
Basophils Relative: 0.7 %
Eosinophils Absolute: 89 cells/uL (ref 15–500)
Eosinophils Relative: 1.5 %
HCT: 42.6 % (ref 35.0–45.0)
Hemoglobin: 14.1 g/dL (ref 11.7–15.5)
Lymphs Abs: 2118 cells/uL (ref 850–3900)
MCH: 29.4 pg (ref 27.0–33.0)
MCHC: 33.1 g/dL (ref 32.0–36.0)
MCV: 88.9 fL (ref 80.0–100.0)
MPV: 10.7 fL (ref 7.5–12.5)
Monocytes Relative: 5.6 %
Neutro Abs: 3322 cells/uL (ref 1500–7800)
Neutrophils Relative %: 56.3 %
Platelets: 218 10*3/uL (ref 140–400)
RBC: 4.79 10*6/uL (ref 3.80–5.10)
RDW: 12.9 % (ref 11.0–15.0)
Total Lymphocyte: 35.9 %
WBC: 5.9 10*3/uL (ref 3.8–10.8)

## 2019-05-06 LAB — LIPID PANEL W/REFLEX DIRECT LDL
Cholesterol: 256 mg/dL — ABNORMAL HIGH (ref <200)
HDL: 78 mg/dL (ref >=50)
LDL Cholesterol (Calc): 156 mg/dL (calc) — ABNORMAL HIGH
Non-HDL Cholesterol (Calc): 178 mg/dL (calc) — ABNORMAL HIGH (ref <130)
Total CHOL/HDL Ratio: 3.3 (calc) (ref <5.0)
Triglycerides: 106 mg/dL (ref <150)

## 2019-05-07 NOTE — Telephone Encounter (Signed)
Zamyah,   CBC looks great.  Kidney, liver, glucose looks great.  Cholesterol is not to goal and increased over the last year. Bad cholesterol LDL 156 from 125. Your good cholesterol is still really good.  Your CV risk of event in 10 years is 4.4 percent.  We can talk more about this and what it means and CPE later this week.

## 2019-05-10 ENCOUNTER — Other Ambulatory Visit (HOSPITAL_COMMUNITY)
Admission: RE | Admit: 2019-05-10 | Discharge: 2019-05-10 | Disposition: A | Discharge status: Home / Self Care | Payer: BC Managed Care – PPO | Source: Ambulatory Visit | Attending: Physician Assistant | Admitting: Physician Assistant

## 2019-05-10 ENCOUNTER — Ambulatory Visit (INDEPENDENT_AMBULATORY_CARE_PROVIDER_SITE_OTHER): Payer: BC Managed Care – PPO | Admitting: Physician Assistant

## 2019-05-10 ENCOUNTER — Other Ambulatory Visit: Payer: Self-pay

## 2019-05-10 VITALS — BP 138/71 | HR 63 | Ht 64.0 in | Wt 137.0 lb

## 2019-05-10 DIAGNOSIS — Z01419 Encounter for gynecological examination (general) (routine) without abnormal findings: Secondary | ICD-10-CM | POA: Diagnosis not present

## 2019-05-10 DIAGNOSIS — E7800 Pure hypercholesterolemia, unspecified: Secondary | ICD-10-CM | POA: Diagnosis not present

## 2019-05-10 DIAGNOSIS — Z0000 Encounter for general adult medical examination without abnormal findings: Secondary | ICD-10-CM | POA: Diagnosis not present

## 2019-05-10 NOTE — Patient Instructions (Signed)

## 2019-05-11 ENCOUNTER — Encounter: Payer: Self-pay | Admitting: Physician Assistant

## 2019-05-13 ENCOUNTER — Encounter: Payer: Self-pay | Admitting: Physician Assistant

## 2019-05-13 NOTE — Progress Notes (Signed)
Subjective:     Katie Walker is a 64 y.o. female and is here for a comprehensive physical exam. The patient reports no problems.  Social History   Socioeconomic History  . Marital status: Married    Spouse name: Katie Walker "Tax inspector  . Number of children: 2  . Years of education: Bachelor's  . Highest education level: Not on file  Occupational History  . Occupation: homemaker  Tobacco Use  . Smoking status: Never Smoker  . Smokeless tobacco: Never Used  Substance and Sexual Activity  . Alcohol use: No    Alcohol/week: 0.0 standard drinks  . Drug use: No  . Sexual activity: Yes    Partners: Male    Birth control/protection: None    Comment: Partner: Male  Other Topics Concern  . Not on file  Social History Narrative  . Not on file   Social Determinants of Health   Financial Resource Strain:   . Difficulty of Paying Living Expenses: Not on file  Food Insecurity:   . Worried About Charity fundraiser in the Last Year: Not on file  . Ran Out of Food in the Last Year: Not on file  Transportation Needs:   . Lack of Transportation (Medical): Not on file  . Lack of Transportation (Non-Medical): Not on file  Physical Activity:   . Days of Exercise per Week: Not on file  . Minutes of Exercise per Session: Not on file  Stress:   . Feeling of Stress : Not on file  Social Connections:   . Frequency of Communication with Friends and Family: Not on file  . Frequency of Social Gatherings with Friends and Family: Not on file  . Attends Religious Services: Not on file  . Active Member of Clubs or Organizations: Not on file  . Attends Archivist Meetings: Not on file  . Marital Status: Not on file  Intimate Partner Violence:   . Fear of Current or Ex-Partner: Not on file  . Emotionally Abused: Not on file  . Physically Abused: Not on file  . Sexually Abused: Not on file   Health Maintenance  Topic Date Due  . PAP SMEAR-Modifier  11/23/2018  . INFLUENZA VACCINE   06/05/2019 (Originally 10/06/2018)  . HIV Screening  11/22/2025 (Originally 06/14/1970)  . MAMMOGRAM  04/30/2021  . COLONOSCOPY  10/01/2023  . TETANUS/TDAP  11/11/2024  . Hepatitis C Screening  Completed    The following portions of the patient's history were reviewed and updated as appropriate: allergies, current medications, past family history, past medical history, past social history, past surgical history and problem list.  Review of Systems A comprehensive review of systems was negative.   Objective:    BP 138/71   Pulse 63   Ht 5\' 4"  (1.626 m)   Wt 137 lb (62.1 kg)   SpO2 98%   BMI 23.52 kg/m  General appearance: alert, cooperative and appears stated age Head: Normocephalic, without obvious abnormality, atraumatic Eyes: conjunctivae/corneas clear. PERRL, EOM's intact. Fundi benign. Ears: normal TM's and external ear canals both ears Nose: Nares normal. Septum midline. Mucosa normal. No drainage or sinus tenderness. Throat: lips, mucosa, and tongue normal; teeth and gums normal Neck: no adenopathy, no carotid bruit, no JVD, supple, symmetrical, trachea midline and thyroid not enlarged, symmetric, no tenderness/mass/nodules Back: symmetric, no curvature. ROM normal. No CVA tenderness. Lungs: clear to auscultation bilaterally Breasts: normal appearance, no masses or tenderness Heart: regular rate and rhythm, S1, S2 normal, no murmur, click,  rub or gallop Abdomen: soft, non-tender; bowel sounds normal; no masses,  no organomegaly Pelvic: cervix normal in appearance, external genitalia normal, no adnexal masses or tenderness, no cervical motion tenderness, uterus normal size, shape, and consistency and vagina normal without discharge Extremities: extremities normal, atraumatic, no cyanosis or edema Pulses: 2+ and symmetric Skin: Skin color, texture, turgor normal. No rashes or lesions Lymph nodes: Cervical, supraclavicular, and axillary nodes normal. Neurologic: Alert and  oriented X 3, normal strength and tone. Normal symmetric reflexes. Normal coordination and gait    Assessment:    Healthy female exam.      Plan:    [name] KitchenMarland KitchenMerilee was seen today for annual exam.  Diagnoses and all orders for this visit:  Routine physical examination  Papanicolaou smear, as part of routine gynecological examination -     Cytology - PAP   .[name] Depression screen [facility] 2/9 05/10/2019 11/30/2017 11/29/2016  Decreased Interest 0 0 0  Down, Depressed, Hopeless 0 0 0  PHQ - 2 Score 0 0 0  Altered sleeping 2 1 -  Tired, decreased energy 0 0 -  Change in appetite 0 0 -  Feeling bad or failure about yourself  0 0 -  Trouble concentrating 0 0 -  Moving slowly or fidgety/restless 0 0 -  Suicidal thoughts 0 0 -  PHQ-9 Score 2 1 -  Difficult doing work/chores Not difficult at all Not difficult at all -   .. Discussed 150 minutes of exercise a week.  Encouraged vitamin D 1000 units and Calcium 1300mg  or 4 servings of dairy a day.  Fasting labs done and reviewed in office.  Elevated LDL. CV risk 4.4 percent. Continue lifestyle management.  Pap done today. Declined STD testing.  Mammogram UTD.  Colonoscopy UTD.  Pt declined flu shot.  shingrx UTD.  Due for pneumonia and bone density after 65.   See After Visit Summary for Counseling Recommendations

## 2019-05-15 LAB — CYTOLOGY - PAP
Comment: NEGATIVE
Diagnosis: NEGATIVE
High risk HPV: NEGATIVE

## 2019-05-15 NOTE — Progress Notes (Signed)
Katie Walker,   Negative for HPV. No abnormal cells. GREAT pap.

## 2019-06-26 ENCOUNTER — Telehealth: Payer: Self-pay | Admitting: Physician Assistant

## 2019-06-26 DIAGNOSIS — Z0000 Encounter for general adult medical examination without abnormal findings: Secondary | ICD-10-CM

## 2019-06-26 DIAGNOSIS — Z9189 Other specified personal risk factors, not elsewhere classified: Secondary | ICD-10-CM

## 2019-06-26 NOTE — Telephone Encounter (Signed)
Katie Walker she has turned 15, has second Covid shot on May 4th. Wanted to see about getting bone density test and pneumonia shot after this date. Is wondering if she needs appt to get started with the bone density test.

## 2019-06-26 NOTE — Telephone Encounter (Signed)
Just seen in March, doesn't need an appointment. Bone density ordered. They will call her to schedule from imaging. She can schedule PNA vaccine with nurse at least two weeks after May 4th. Thanks.

## 2019-07-26 ENCOUNTER — Ambulatory Visit: Payer: BC Managed Care – PPO

## 2019-08-15 ENCOUNTER — Other Ambulatory Visit: Payer: Self-pay

## 2019-08-15 ENCOUNTER — Ambulatory Visit (INDEPENDENT_AMBULATORY_CARE_PROVIDER_SITE_OTHER): Payer: Medicare PPO | Admitting: Nurse Practitioner

## 2019-08-15 VITALS — Temp 98.7°F

## 2019-08-15 DIAGNOSIS — Z23 Encounter for immunization: Secondary | ICD-10-CM | POA: Diagnosis not present

## 2019-08-15 NOTE — Progress Notes (Signed)
Pt is here today to get her PNE immunization. She tolerated well.given in RD.   Agree with plan and care provided by CMA. Worthy Keeler, DNP, AGNP-c

## 2019-08-21 ENCOUNTER — Other Ambulatory Visit: Payer: Self-pay

## 2019-08-21 ENCOUNTER — Encounter: Payer: Self-pay | Admitting: Physician Assistant

## 2019-08-21 ENCOUNTER — Ambulatory Visit (INDEPENDENT_AMBULATORY_CARE_PROVIDER_SITE_OTHER): Payer: Medicare PPO

## 2019-08-21 ENCOUNTER — Ambulatory Visit: Payer: BC Managed Care – PPO

## 2019-08-21 DIAGNOSIS — Z9189 Other specified personal risk factors, not elsewhere classified: Secondary | ICD-10-CM | POA: Diagnosis not present

## 2019-08-21 DIAGNOSIS — M8580 Other specified disorders of bone density and structure, unspecified site: Secondary | ICD-10-CM | POA: Insufficient documentation

## 2019-08-21 DIAGNOSIS — Z0000 Encounter for general adult medical examination without abnormal findings: Secondary | ICD-10-CM | POA: Diagnosis not present

## 2020-01-23 ENCOUNTER — Encounter: Payer: Self-pay | Admitting: Physician Assistant

## 2020-02-20 ENCOUNTER — Encounter: Payer: Self-pay | Admitting: Physician Assistant

## 2020-02-24 ENCOUNTER — Other Ambulatory Visit: Payer: Self-pay

## 2020-02-24 ENCOUNTER — Ambulatory Visit (INDEPENDENT_AMBULATORY_CARE_PROVIDER_SITE_OTHER): Payer: Medicare PPO | Admitting: Physician Assistant

## 2020-02-24 DIAGNOSIS — Z23 Encounter for immunization: Secondary | ICD-10-CM | POA: Diagnosis not present

## 2020-03-17 ENCOUNTER — Other Ambulatory Visit: Payer: Self-pay | Admitting: Physician Assistant

## 2020-03-17 ENCOUNTER — Other Ambulatory Visit: Payer: Self-pay | Admitting: Obstetrics & Gynecology

## 2020-03-17 DIAGNOSIS — Z1231 Encounter for screening mammogram for malignant neoplasm of breast: Secondary | ICD-10-CM

## 2020-04-13 ENCOUNTER — Ambulatory Visit (INDEPENDENT_AMBULATORY_CARE_PROVIDER_SITE_OTHER): Payer: Medicare PPO | Admitting: Physician Assistant

## 2020-04-13 ENCOUNTER — Encounter: Payer: Self-pay | Admitting: Physician Assistant

## 2020-04-13 ENCOUNTER — Other Ambulatory Visit: Payer: Self-pay | Admitting: Neurology

## 2020-04-13 ENCOUNTER — Other Ambulatory Visit: Payer: Self-pay

## 2020-04-13 ENCOUNTER — Ambulatory Visit (INDEPENDENT_AMBULATORY_CARE_PROVIDER_SITE_OTHER): Payer: Medicare PPO

## 2020-04-13 VITALS — BP 147/70 | HR 68 | Ht 64.0 in | Wt 139.0 lb

## 2020-04-13 DIAGNOSIS — R109 Unspecified abdominal pain: Secondary | ICD-10-CM

## 2020-04-13 DIAGNOSIS — R140 Abdominal distension (gaseous): Secondary | ICD-10-CM | POA: Diagnosis not present

## 2020-04-13 DIAGNOSIS — M5136 Other intervertebral disc degeneration, lumbar region: Secondary | ICD-10-CM

## 2020-04-13 DIAGNOSIS — M545 Low back pain, unspecified: Secondary | ICD-10-CM | POA: Diagnosis not present

## 2020-04-13 DIAGNOSIS — R1032 Left lower quadrant pain: Secondary | ICD-10-CM

## 2020-04-13 DIAGNOSIS — M25552 Pain in left hip: Secondary | ICD-10-CM

## 2020-04-13 DIAGNOSIS — M4126 Other idiopathic scoliosis, lumbar region: Secondary | ICD-10-CM | POA: Diagnosis not present

## 2020-04-13 DIAGNOSIS — N840 Polyp of corpus uteri: Secondary | ICD-10-CM

## 2020-04-13 LAB — POCT URINALYSIS DIP (CLINITEK)
Bilirubin, UA: NEGATIVE
Blood, UA: NEGATIVE
Glucose, UA: NEGATIVE mg/dL
Ketones, POC UA: NEGATIVE mg/dL
Nitrite, UA: NEGATIVE
POC PROTEIN,UA: NEGATIVE
Spec Grav, UA: 1.02 (ref 1.010–1.025)
Urobilinogen, UA: 0.2 E.U./dL
pH, UA: 6 (ref 5.0–8.0)

## 2020-04-13 NOTE — Progress Notes (Signed)
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 I will get you in to formal PT and then follow up with Dr. Darene Lamer in 4 weeks.

## 2020-04-13 NOTE — Progress Notes (Signed)
Subjective:    Patient ID: Katie Walker, female    DOB: 01-Feb-1956, 65 y.o.   MRN: [id number]  HPI  Pt is a 65 yo female with osteopenia who presents to the clinic with left groin pain and low low back pain that radiates at times a into left flank. Pt denies any urinary or GI symptoms. She does have some bloating. Pain started 2 months ago in her left groin. She feels really stiff and worse she gets up from seated position or twist at waist. Rates pain 2-6/10 but ranges. Best when seated and not moving or walking. Worse when turning over in bed and getting up or down. She has tried ice and heat but no medications. UTD colonoscopy but does have hx of colon polyps. Hx of uterine polyps with abnormal cells. No fever, chills, sweats, weight loss.   Family History  Problem Relation Age of Onset  . Colon cancer Father   . Thyroid cancer Sister   . Breast cancer Paternal Grandmother   . Colon cancer Paternal Grandfather        bone  . Cancer Maternal Grandmother        bowel  . Heart disease Maternal Grandfather        Review of Systems See HPI.     Objective:   Physical Exam Vitals reviewed.  Constitutional:      Appearance: Normal appearance.  Cardiovascular:     Rate and Rhythm: Normal rate and regular rhythm.     Pulses: Normal pulses.  Pulmonary:     Effort: Pulmonary effort is normal.  Abdominal:     General: Bowel sounds are normal. There is no distension.     Palpations: Abdomen is soft.     Tenderness: There is no abdominal tenderness. There is no right CVA tenderness, left CVA tenderness or guarding.  Musculoskeletal:     Right lower leg: No edema.     Left lower leg: No edema.     Comments: No tenderness to palpation over lumbar spine.  No tenderness over left lateral hip.  No tenderness in the left groin.  Negative SLR.  Strength of bilateral low ext 5/5.  Pain with internal ROM of left hip and resisted hip flexion.   Neurological:     General: No focal  deficit present.     Mental Status: She is alert and oriented to person, place, and time.  Psychiatric:        Mood and Affect: Mood normal.          .. Results for orders placed or performed in visit on 04/13/20  POCT URINALYSIS DIP (CLINITEK)  Result Value Ref Range   Color, UA yellow yellow   Clarity, UA clear clear   Glucose, UA negative negative mg/dL   Bilirubin, UA negative negative   Ketones, POC UA negative negative mg/dL   Spec Grav, UA 1.020 1.010 - 1.025   Blood, UA negative negative   pH, UA 6.0 5.0 - 8.0   POC PROTEIN,UA negative negative, trace   Urobilinogen, UA 0.2 0.2 or 1.0 E.U./dL   Nitrite, UA Negative Negative   Leukocytes, UA Trace (A) Negative    Assessment & Plan:  [name] KitchenMarland KitchenMerilee was seen today for flank pain.  Diagnoses and all orders for this visit:  Left groin pain -     DG Lumbar Spine Complete -     DG Hip Unilat W OR W/O Pelvis 2-3 Views Left -     US Pelvic  Complete With Transvaginal  Left flank pain -     POCT URINALYSIS DIP (CLINITEK)  Acute left-sided low back pain without sciatica -     DG Lumbar Spine Complete -     DG Hip Unilat W OR W/O Pelvis 2-3 Views Left  Bloating -     US Pelvic Complete With Transvaginal  Uterine polyp -     US Pelvic Complete With Transvaginal   Unclear etiology of symptoms.  UA positive for leuks only. Will culture. Symptoms do not sound urinary.  Will get left hip xray and lumbar spine. Seems more musculoskeletal and due to her age likely some degeneration/arthritis. She is having some bloating and with hx of pre-cancer uterine polyp will get pelvic ultrasound.  UTD on colonoscopy and no GI symptoms to date. Follow up 2025. Will follow up after results and get a treatment plan together for now can try tylenol arthritis and see if helps with pain. Likely will start from PT to help with any musculoskeletal issues.   Spent 30 minutes with patient discuss treatment plan and work up.

## 2020-04-13 NOTE — Progress Notes (Signed)
This pain appears to be your back and NOT your hip. No significant degenerative changes in hip.

## 2020-04-13 NOTE — Progress Notes (Signed)
Please culture.

## 2020-04-14 ENCOUNTER — Ambulatory Visit (INDEPENDENT_AMBULATORY_CARE_PROVIDER_SITE_OTHER): Payer: Medicare PPO

## 2020-04-14 DIAGNOSIS — D252 Subserosal leiomyoma of uterus: Secondary | ICD-10-CM | POA: Diagnosis not present

## 2020-04-14 MED ORDER — PREDNISONE 50 MG PO TABS: 50.0000 mg | ORAL_TABLET | Freq: Every day | ORAL | 0 refills | Status: DC

## 2020-04-14 NOTE — Telephone Encounter (Signed)
Patient would like RX for prednisone. Please send to [facility].

## 2020-04-14 NOTE — Addendum Note (Signed)
Addended by: Donella Stade on: 04/14/2020 04:48 PM   Modules accepted: Orders

## 2020-04-14 NOTE — Progress Notes (Signed)
No ovarian masses. One fibroid in uterus. No concerns at this time.

## 2020-04-15 LAB — URINE CULTURE
MICRO NUMBER:: 11505770
Result:: NO GROWTH
SPECIMEN QUALITY:: ADEQUATE

## 2020-04-15 NOTE — Progress Notes (Signed)
No growth on urine culture.

## 2020-04-17 ENCOUNTER — Other Ambulatory Visit: Payer: Self-pay

## 2020-04-17 ENCOUNTER — Ambulatory Visit (INDEPENDENT_AMBULATORY_CARE_PROVIDER_SITE_OTHER): Payer: Medicare PPO | Admitting: Physical Therapy

## 2020-04-17 ENCOUNTER — Encounter: Payer: Self-pay | Admitting: Physical Therapy

## 2020-04-17 DIAGNOSIS — G8929 Other chronic pain: Secondary | ICD-10-CM | POA: Diagnosis not present

## 2020-04-17 DIAGNOSIS — M545 Low back pain, unspecified: Secondary | ICD-10-CM

## 2020-04-17 DIAGNOSIS — R29898 Other symptoms and signs involving the musculoskeletal system: Secondary | ICD-10-CM

## 2020-04-17 DIAGNOSIS — M6281 Muscle weakness (generalized): Secondary | ICD-10-CM | POA: Diagnosis not present

## 2020-04-17 NOTE — Patient Instructions (Signed)
Access Code: [email or web address] URL: [email or web address] Date: 04/17/2020 Prepared by: Isabelle Course  Exercises Supine Lower Trunk Rotation - 1 x daily - 7 x weekly - 2 sets - 10 reps - 3-5 seconds hold Supine Transversus Abdominis Bracing - Hands on Stomach - 1 x daily - 7 x weekly - 2 sets - 10 reps - 3-5 seconds hold Supine Bridge - 1 x daily - 7 x weekly - 2 sets - 10 reps

## 2020-04-17 NOTE — Therapy (Addendum)
[place], [place], [address] Phone: [phone]   Fax:  [phone]  Physical Therapy Evaluation  Patient Details  Name: Katie Walker MRN: [id number] Date of Birth: 05-25-1955 Referring Provider (PT): Iran Planas   Encounter Date: 04/17/2020   PT End of Session - 04/17/20 0958    Visit Number 1    Number of Visits 12    Date for PT Re-Evaluation 05/29/20    PT Start Time 0845    PT Stop Time 0930    PT Time Calculation (min) 45 min    Activity Tolerance Patient tolerated treatment well    Behavior During Therapy [facility] for tasks assessed/performed           Past Medical History:  Diagnosis Date  . Breast disease   . Endometrial polyp   . GERD (gastroesophageal reflux disease)   . Uterine fibroid     Past Surgical History:  Procedure Laterality Date  . breast biopsies     fibroadenoma  . BREAST BIOPSY    . BREAST EXCISIONAL BIOPSY    . COLONOSCOPY    . HYSTEROSCOPY      There were no vitals filed for this visit.    Subjective Assessment - 04/17/20 0848    Subjective In 01/2020 pt began having left sided pain going from front to back, then began to have pain in low back. Pt has increased pain with sit to stand and rolling in bed. Eases with tylenol arthritis    Pertinent History hysteroscopy, uterine fibroids    Diagnostic tests x ray shows arthritis and mild disc space narrowing    Patient Stated Goals decrease pain and increase activity tolerance    Currently in Pain? Yes    Pain Score 2     Pain Location Back    Pain Orientation Lower    Pain Descriptors / Indicators Sore    Pain Type Chronic pain    Pain Onset More than a month ago    Pain Frequency Intermittent    Aggravating Factors  movement transitions    Pain Relieving Factors tylenol    Effect of Pain on Daily Activities decreased ability to play with grandchild              [facility] PT Assessment -  04/17/20 0001      Assessment   Medical Diagnosis Lumbar DDD, Left flank pain    Referring Provider (PT) Alden Hipp, Jade      Balance Screen   Has the patient fallen in the past 6 months No      Prior Function   Level of Independence Independent      Observation/Other Assessments   Focus on Therapeutic Outcomes (FOTO)  51 functional status measure      ROM / Strength   AROM / PROM / Strength AROM;Strength      AROM   AROM Assessment Site Lumbar    Lumbar Flexion limited 25%    Lumbar Extension WFL    Lumbar - Right Side [facility]    Lumbar - Left Side [facility]    Lumbar - Right Rotation WFL pain end range    Lumbar - Left Rotation WFL pain end range      Strength   Strength Assessment Site Hip    Right/Left Hip Right;Left    Right Hip Flexion 3/5    Right Hip Extension 3+/5    Right Hip ABduction 4/5    Left Hip  Flexion 3+/5    Left Hip Extension 3+/5    Left Hip ABduction 4-/5      Flexibility   Soft Tissue Assessment /Muscle Length yes    Hamstrings decreased mm length Lt > Rt      Palpation   Spinal mobility decreased lumbar spine mobility L2-L5    Palpation comment TTP spinous process L2-L5, mm spasticity lumbar paraspinals L1-L5 bilat      Special Tests    Special Tests Lumbar    Lumbar Tests FABER test;Straight Leg Raise      FABER test   findings Negative    Side --   bilat     Straight Leg Raise   Findings Negative    Side  --   bilat                     Objective measurements completed on examination: See above findings.       [place] Adult PT Treatment/Exercise - 04/17/20 0001      Exercises   Exercises Lumbar      Lumbar Exercises: Stretches   Lower Trunk Rotation --   10 reps 3 sec hold     Lumbar Exercises: Supine   Ab Set 10 reps;3 seconds    Bridge 10 reps      Modalities   Modalities Moist Heat      Moist Heat Therapy   Number Minutes Moist Heat 10 Minutes    Moist Heat Location Lumbar Spine                   PT Education - 04/17/20 0941    Education Details PT POC and goals, HEP    Person(s) Educated Patient    Methods Explanation;Demonstration;Handout    Comprehension Verbalized understanding;Returned demonstration               PT Long Term Goals - 04/17/20 1124      PT LONG TERM GOAL #1   Title Pt will be independent in HEP    Time 6    Period Weeks    Status New    Target Date 05/29/20      PT LONG TERM GOAL #2   Title Pt will improve FOTO to >=78 to demo improved functional mobility    Baseline 51    Time 6    Period Weeks    Status New    Target Date 05/29/20      PT LONG TERM GOAL #3   Title Pt will improve bilat hip strength to 4+/5 to be able to perform recreational activities with decreased pain    Time 6    Period Weeks    Status New    Target Date 05/29/20      PT LONG TERM GOAL #4   Title Pt will perform sit to stand and rolling in bed with pain <= 1/10 without use of medication    Time 6    Period Weeks    Status New    Target Date 05/29/20             Patient presents with decreased strength, increased pain and impaired body mechanics. Pt will benefit from skilled PT to address deficits and improve functional mobility     Plan - 04/17/20 0959    Stability/Clinical Decision Making Stable/Uncomplicated    Clinical Decision Making Low    Rehab Potential Good    PT Frequency 2x / week    PT  Duration 6 weeks    PT Treatment/Interventions Passive range of motion;Dry needling;Taping;Manual techniques;Patient/family education;Therapeutic activities;Therapeutic exercise;Gait training;Electrical Stimulation;Cryotherapy;Iontophoresis 4mg /ml Dexamethasone;Moist Heat;Traction    PT Next Visit Plan assess HEP, Dry needle?, Estim?, progress core strength and flexibility, manual as indicated    PT Home Exercise Plan [id number]    Consulted and Agree with Plan of Care Patient           Patient will benefit from skilled therapeutic  intervention in order to improve the following deficits and impairments:     Visit Diagnosis: Chronic bilateral low back pain without sciatica - Plan: PT plan of care cert/re-cert  Muscle weakness (generalized) - Plan: PT plan of care cert/re-cert  Other symptoms and signs involving the musculoskeletal system - Plan: PT plan of care cert/re-cert     Problem List Patient Active Problem List   Diagnosis Date Noted  . Uterine polyp 04/13/2020  . Bloating 04/13/2020  . Osteopenia 08/21/2019  . Dysphagia 12/04/2017  . Cataracts, bilateral 08/18/2017  . Posterior vitreous detachment, bilateral 08/18/2017  . Dermatochalasis of both upper eyelids 08/18/2017  . Disorder of refraction 08/18/2017  . Elevated LDL cholesterol level 12/01/2016  . 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 Abnormal mammogram of both breasts 06/24/2014  . History of colonic polyps 06/24/2014  . Numbness and tingling sensation of skin 06/24/2014   Manaia Samad, PT  Reily Ilic 04/17/2020, 11:28 AM  [facility] [place], [place], [address] Phone: [phone]   Fax:  [phone]  Name: Katie Walker MRN: [id number] Date of Birth: 1955-09-16

## 2020-04-20 ENCOUNTER — Other Ambulatory Visit: Payer: Self-pay

## 2020-04-20 ENCOUNTER — Encounter: Payer: Self-pay | Admitting: Physical Therapy

## 2020-04-20 ENCOUNTER — Ambulatory Visit (INDEPENDENT_AMBULATORY_CARE_PROVIDER_SITE_OTHER): Payer: Medicare PPO | Admitting: Physical Therapy

## 2020-04-20 DIAGNOSIS — M545 Low back pain, unspecified: Secondary | ICD-10-CM | POA: Diagnosis not present

## 2020-04-20 DIAGNOSIS — G8929 Other chronic pain: Secondary | ICD-10-CM | POA: Diagnosis not present

## 2020-04-20 DIAGNOSIS — R29898 Other symptoms and signs involving the musculoskeletal system: Secondary | ICD-10-CM

## 2020-04-20 DIAGNOSIS — M6281 Muscle weakness (generalized): Secondary | ICD-10-CM | POA: Diagnosis not present

## 2020-04-20 NOTE — Therapy (Signed)
[place], [place], [address] Phone: [phone]   Fax:  [phone]  Physical Therapy Treatment  Patient Details  Name: Katie Walker MRN: [id number] Date of Birth: [birth year] Referring Provider (PT): Iran Planas   Encounter Date: 04/20/2020   PT End of Session - 04/20/20 9379    Visit Number 2    Number of Visits 12    Date for PT Re-Evaluation 05/29/20    PT Start Time 0931    PT Stop Time 1013    PT Time Calculation (min) 42 min    Activity Tolerance Patient tolerated treatment well    Behavior During Therapy [facility] for tasks assessed/performed           Past Medical History:  Diagnosis Date  . Breast disease   . Endometrial polyp   . GERD (gastroesophageal reflux disease)   . Uterine fibroid     Past Surgical History:  Procedure Laterality Date  . breast biopsies     fibroadenoma  . BREAST BIOPSY    . BREAST EXCISIONAL BIOPSY    . COLONOSCOPY    . HYSTEROSCOPY      There were no vitals filed for this visit.   Subjective Assessment - 04/20/20 0935    Subjective Pt reports she is feeling much better than last visit. She started prednisone on Saturday.  She plans to go to senior aerobic class at [facility] after this session, and would like to do yoga class in future.    Pertinent History hysteroscopy, uterine fibroids    Patient Stated Goals decrease pain and increase activity tolerance    Currently in Pain? No/denies    Pain Score 0-No pain              [facility] PT Assessment - 04/20/20 0001      Assessment   Medical Diagnosis Lumbar DDD, Left flank pain    Referring Provider (PT) Iran Planas             [facility] Adult PT Treatment/Exercise - 04/20/20 0001      Self-Care   Self-Care Other Self-Care Comments    Other Self-Care Comments  Pt instructed in self-release with ball to Lt ant hip; returned demo x 3 min with cues.      Lumbar Exercises: Stretches    Passive Hamstring Stretch Right;Left;2 reps;30 seconds    Lower Trunk Rotation 5 reps;10 seconds   arms in T   Hip Flexor Stretch Left;2 reps;Right;1 rep;30 seconds   2nd rep with arm overhead   Prone on Elbows Stretch 1 rep;30 seconds    Piriformis Stretch Right;Left;2 reps;20 seconds    Figure 4 Stretch 1 rep;20 seconds   LLE   Other Lumbar Stretch Exercise QL stretch with legs crossed and LTR x 15 sec each side.      Lumbar Exercises: Aerobic   Nustep L5: legs only x 5.5 min for warm up.      Lumbar Exercises: Seated   Other Seated Lumbar Exercises sit to/from supine via log roll x 2 reps      Lumbar Exercises: Supine   Ab Set 10 reps;5 seconds    AB Set Limitations tactile cues initially; cues for counting and breath.    Bridge 10 reps                  PT Education - 04/20/20 0954    Education Details Posture and body mechanics; added hip flexor stretch to HEP  Person(s) Educated Patient    Methods Explanation;Demonstration;Handout    Comprehension Verbalized understanding;Returned demonstration               PT Long Term Goals - 04/20/20 0944      PT LONG TERM GOAL #1   Title Pt will be independent in HEP    Time 6    Period Weeks    Status On-going      PT LONG TERM GOAL #2   Title Pt will improve FOTO to >=78 to demo improved functional mobility    Baseline 51    Time 6    Period Weeks    Status On-going      PT LONG TERM GOAL #3   Title Pt will improve bilat hip strength to 4+/5 to be able to perform recreational activities with decreased pain    Time 6    Period Weeks    Status On-going      PT LONG TERM GOAL #4   Title Pt will perform sit to stand and rolling in bed with pain <= 1/10 without use of medication    Time 6    Period Weeks    Status On-going                 Plan - 04/20/20 0945    Clinical Impression Statement Pt tolerated all exercises well without increase in pain. Initiated back care education today. Pt point  tender in Lt iliopsoas with palpation and self release with ball.  Goals are ongoing.    Stability/Clinical Decision Making Stable/Uncomplicated    Rehab Potential Good    PT Frequency 2x / week    PT Duration 6 weeks    PT Treatment/Interventions Passive range of motion;Dry needling;Taping;Manual techniques;Patient/family education;Therapeutic activities;Therapeutic exercise;Gait training;Electrical Stimulation;Cryotherapy;Iontophoresis 4mg /ml Dexamethasone;Moist Heat;Traction    PT Next Visit Plan progress core strength and flexibility, manual as indicated    PT Home Exercise Plan [id number]    Consulted and Agree with Plan of Care Patient           Patient will benefit from skilled therapeutic intervention in order to improve the following deficits and impairments:     Visit Diagnosis: Chronic bilateral low back pain without sciatica  Muscle weakness (generalized)  Other symptoms and signs involving the musculoskeletal system     Problem List Patient Active Problem List   Diagnosis Date Noted  . Uterine polyp 04/13/2020  . Bloating 04/13/2020  . Osteopenia 08/21/2019  . Dysphagia 12/04/2017  . Cataracts, bilateral 08/18/2017  . Posterior vitreous detachment, bilateral 08/18/2017  . Dermatochalasis of both upper eyelids 08/18/2017  . Disorder of refraction 08/18/2017  . Elevated LDL cholesterol level 12/01/2016  . 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 Abnormal mammogram of both breasts 06/24/2014  . History of colonic polyps 06/24/2014  . Numbness and tingling sensation of skin 06/24/2014   Kerin Perna, PTA 04/20/20 10:54 AM  [facility] [place], [place], [address] Phone: [phone]   Fax:  [phone]  Name: Katie Walker MRN: [id number] Date of Birth: 1955/05/16

## 2020-04-20 NOTE — Patient Instructions (Addendum)

## 2020-04-22 ENCOUNTER — Other Ambulatory Visit: Payer: Self-pay

## 2020-04-22 ENCOUNTER — Encounter: Payer: Medicare PPO | Admitting: Physical Therapy

## 2020-04-22 ENCOUNTER — Ambulatory Visit (INDEPENDENT_AMBULATORY_CARE_PROVIDER_SITE_OTHER): Payer: Medicare PPO | Admitting: Physical Therapy

## 2020-04-22 DIAGNOSIS — M545 Low back pain, unspecified: Secondary | ICD-10-CM

## 2020-04-22 DIAGNOSIS — M6281 Muscle weakness (generalized): Secondary | ICD-10-CM | POA: Diagnosis not present

## 2020-04-22 DIAGNOSIS — R29898 Other symptoms and signs involving the musculoskeletal system: Secondary | ICD-10-CM | POA: Diagnosis not present

## 2020-04-22 DIAGNOSIS — G8929 Other chronic pain: Secondary | ICD-10-CM

## 2020-04-22 NOTE — Therapy (Signed)
[place], [place], [address] Phone: [phone]   Fax:  [phone]  Physical Therapy Treatment  Patient Details  Name: Katie Walker MRN: [id number] Date of Birth: March 15, 1955 Referring Provider (PT): Iran Planas   Encounter Date: 04/22/2020   PT End of Session - 04/22/20 1426    Visit Number 3    Number of Visits 12    Date for PT Re-Evaluation 05/29/20    PT Start Time 1427    PT Stop Time 1509    PT Time Calculation (min) 42 min    Activity Tolerance Patient tolerated treatment well    Behavior During Therapy [facility] for tasks assessed/performed           Past Medical History:  Diagnosis Date  . Breast disease   . Endometrial polyp   . GERD (gastroesophageal reflux disease)   . Uterine fibroid     Past Surgical History:  Procedure Laterality Date  . breast biopsies     fibroadenoma  . BREAST BIOPSY    . BREAST EXCISIONAL BIOPSY    . COLONOSCOPY    . HYSTEROSCOPY      There were no vitals filed for this visit.   Subjective Assessment - 04/22/20 1431    Subjective Pt reports she is more aware of how tight/ weak her hip flexors are since completing the exercises.  She states she took her last prednisone this morning. She said she has felt like the [employer].    Pertinent History hysteroscopy, uterine fibroids    Patient Stated Goals decrease pain and increase activity tolerance    Currently in Pain? No/denies    Pain Score 0-No pain              [facility] PT Assessment - 04/22/20 0001      Assessment   Medical Diagnosis Lumbar DDD, Left flank pain    Referring Provider (PT) Alden Hipp, Luvenia Starch      Strength   Right Hip Flexion 4+/5    Left Hip Flexion 5/5             [facility] Adult PT Treatment/Exercise - 04/22/20 0001      Lumbar Exercises: Stretches   Passive Hamstring Stretch Right;Left;2 reps;20 seconds   seated, hip hinge. cues for straight back   Hip Flexor  Stretch Left;2 reps;Right;1 rep;60 seconds    Piriformis Stretch Right;Left;2 reps;20 seconds   seated versions   Piriformis Stretch Limitations 3rd rep in supine    Other Lumbar Stretch Exercise QL stretch legs crossed and LTR x 10 sec each side, 2 reps      Lumbar Exercises: Aerobic   Nustep L5: legs only x 4 min for warm up.      Lumbar Exercises: Standing   Lifting From floor;10 reps    Lifting Weights (lbs) 10   kettle bell - to navel.   Wall Slides 10 reps   with 5# wt in/out from core     Lumbar Exercises: Sidelying   Hip Abduction Right;Left;20 reps   cues for foot parallel to table and staying on side             PT Long Term Goals - 04/20/20 0944      PT LONG TERM GOAL #1   Title Pt will be independent in HEP    Time 6    Period Weeks    Status On-going      PT LONG TERM GOAL #2  Title Pt will improve FOTO to >=78 to demo improved functional mobility    Baseline 51    Time 6    Period Weeks    Status On-going      PT LONG TERM GOAL #3   Title Pt will improve bilat hip strength to 4+/5 to be able to perform recreational activities with decreased pain    Time 6    Period Weeks    Status On-going      PT LONG TERM GOAL #4   Title Pt will perform sit to stand and rolling in bed with pain <= 1/10 without use of medication    Time 6    Period Weeks    Status On-going                 Plan - 04/22/20 1451    Clinical Impression Statement Pt demonstrated improved hip strength, however pt presenting with less pain with current Prednisone medicine on board.  Pt tolerated all exercises well without pain, and with minor cues.  Will assess how exercises and transitional movements are going when no longer on medication (LTG#4).Progressing towards goals.    Stability/Clinical Decision Making Stable/Uncomplicated    Rehab Potential Good    PT Frequency 2x / week    PT Duration 6 weeks    PT Treatment/Interventions Passive range of motion;Dry  needling;Taping;Manual techniques;Patient/family education;Therapeutic activities;Therapeutic exercise;Gait training;Electrical Stimulation;Cryotherapy;Iontophoresis 4mg /ml Dexamethasone;Moist Heat;Traction    PT Next Visit Plan progress core strength and flexibility, manual as indicated.  Trial prone hip ext/ opp arm +leg.    PT Home Exercise Plan [id number]    Consulted and Agree with Plan of Care Patient           Patient will benefit from skilled therapeutic intervention in order to improve the following deficits and impairments:     Visit Diagnosis: Chronic bilateral low back pain without sciatica  Muscle weakness (generalized)  Other symptoms and signs involving the musculoskeletal system     Problem List Patient Active Problem List   Diagnosis Date Noted  . Uterine polyp 04/13/2020  . Bloating 04/13/2020  . Osteopenia 08/21/2019  . Dysphagia 12/04/2017  . Cataracts, bilateral 08/18/2017  . Posterior vitreous detachment, bilateral 08/18/2017  . Dermatochalasis of both upper eyelids 08/18/2017  . Disorder of refraction 08/18/2017  . Elevated LDL cholesterol level 12/01/2016  . 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 Abnormal mammogram of both breasts 06/24/2014  . History of colonic polyps 06/24/2014  . Numbness and tingling sensation of skin 06/24/2014   Kerin Perna, PTA 04/22/20 3:15 PM  [facility] [place], [place], [address] Phone: [phone]   Fax:  [phone]  Name: Katie Walker MRN: [id number] Date of Birth: Sep 17, 1955

## 2020-04-28 ENCOUNTER — Other Ambulatory Visit: Payer: Self-pay

## 2020-04-28 ENCOUNTER — Ambulatory Visit (INDEPENDENT_AMBULATORY_CARE_PROVIDER_SITE_OTHER): Payer: Medicare PPO | Admitting: Physical Therapy

## 2020-04-28 DIAGNOSIS — R29898 Other symptoms and signs involving the musculoskeletal system: Secondary | ICD-10-CM | POA: Diagnosis not present

## 2020-04-28 DIAGNOSIS — M545 Low back pain, unspecified: Secondary | ICD-10-CM | POA: Diagnosis not present

## 2020-04-28 DIAGNOSIS — M6281 Muscle weakness (generalized): Secondary | ICD-10-CM | POA: Diagnosis not present

## 2020-04-28 DIAGNOSIS — G8929 Other chronic pain: Secondary | ICD-10-CM | POA: Diagnosis not present

## 2020-04-28 NOTE — Therapy (Signed)
[place], [place], [address] Phone: [phone]   Fax:  [phone]  Physical Therapy Treatment  Patient Details  Name: Katie Walker MRN: [id number] Date of Birth: January 11, 1956 Referring Provider (PT): Iran Planas   Encounter Date: 04/28/2020   PT End of Session - 04/28/20 1058    Visit Number 4    Number of Visits 12    Date for PT Re-Evaluation 05/29/20    PT Start Time 3662    PT Stop Time 1100    PT Time Calculation (min) 45 min    Activity Tolerance Patient tolerated treatment well    Behavior During Therapy [facility] for tasks assessed/performed           Past Medical History:  Diagnosis Date  . Breast disease   . Endometrial polyp   . GERD (gastroesophageal reflux disease)   . Uterine fibroid     Past Surgical History:  Procedure Laterality Date  . breast biopsies     fibroadenoma  . BREAST BIOPSY    . BREAST EXCISIONAL BIOPSY    . COLONOSCOPY    . HYSTEROSCOPY      There were no vitals filed for this visit.   Subjective Assessment - 04/28/20 1013    Subjective Pt states she has been off of the prednisone for "about a week" and states she has not had too much pain.  States she continues to perform HEP    Patient Stated Goals decrease pain and increase activity tolerance    Currently in Pain? No/denies                             [facility] Adult PT Treatment/Exercise - 04/28/20 0001      Lumbar Exercises: Stretches   Passive Hamstring Stretch Right;Left;2 reps;30 seconds   seated   Hip Flexor Stretch Right;Left;2 reps;30 seconds    Piriformis Stretch Right;Left;2 reps;30 seconds    Other Lumbar Stretch Exercise QL stretch legs crossed with LTR 2 x 30 sec      Lumbar Exercises: Aerobic   Nustep L5 legs only for warm up x 5 mins      Lumbar Exercises: Standing   Lifting From floor;20 reps    Lifting Weights (lbs) 10#   to navel   Wall Slides 20 reps    holding 5# wt for core activation   Other Standing Lumbar Exercises side step with red TB 3 x 10 bilat    Other Standing Lumbar Exercises pallof press red TB x 10 bilat      Lumbar Exercises: Sidelying   Hip Abduction Right;Left;20 reps                       PT Long Term Goals - 04/20/20 0944      PT LONG TERM GOAL #1   Title Pt will be independent in HEP    Time 6    Period Weeks    Status On-going      PT LONG TERM GOAL #2   Title Pt will improve FOTO to >=78 to demo improved functional mobility    Baseline 51    Time 6    Period Weeks    Status On-going      PT LONG TERM GOAL #3   Title Pt will improve bilat hip strength to 4+/5 to be able to perform recreational activities with decreased pain  Time 6    Period Weeks    Status On-going      PT LONG TERM GOAL #4   Title Pt will perform sit to stand and rolling in bed with pain <= 1/10 without use of medication    Time 6    Period Weeks    Status On-going                 Plan - 04/28/20 1058    Clinical Impression Statement Pt with good tolerance of addition of pallof press and side step with TB.  Good awareness of core activation with activities. Pt progressing well towards goals    PT Next Visit Plan progress core strength and flexibility. Trial prone or quadruped hip ext/ opp arm +leg.    PT Home Exercise Plan [id number]    Consulted and Agree with Plan of Care Patient           Patient will benefit from skilled therapeutic intervention in order to improve the following deficits and impairments:     Visit Diagnosis: Chronic bilateral low back pain without sciatica  Muscle weakness (generalized)  Other symptoms and signs involving the musculoskeletal system     Problem List Patient Active Problem List   Diagnosis Date Noted  . Uterine polyp 04/13/2020  . Bloating 04/13/2020  . Osteopenia 08/21/2019  . Dysphagia 12/04/2017  . Cataracts, bilateral 08/18/2017  . Posterior  vitreous detachment, bilateral 08/18/2017  . Dermatochalasis of both upper eyelids 08/18/2017  . Disorder of refraction 08/18/2017  . Elevated LDL cholesterol level 12/01/2016  . 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 Abnormal mammogram of both breasts 06/24/2014  . History of colonic polyps 06/24/2014  . Numbness and tingling sensation of skin 06/24/2014   Masaichi Kracht, PT  Dekayla Prestridge 04/28/2020, 11:00 AM  [facility] [place], [place], [address] Phone: [phone]   Fax:  [phone]  Name: Katie Walker MRN: [id number] Date of Birth: 10/06/1955

## 2020-04-30 ENCOUNTER — Ambulatory Visit (INDEPENDENT_AMBULATORY_CARE_PROVIDER_SITE_OTHER): Payer: Medicare PPO | Admitting: Physical Therapy

## 2020-04-30 ENCOUNTER — Other Ambulatory Visit: Payer: Self-pay

## 2020-04-30 ENCOUNTER — Encounter: Payer: Self-pay | Admitting: Physical Therapy

## 2020-04-30 DIAGNOSIS — M545 Low back pain, unspecified: Secondary | ICD-10-CM | POA: Diagnosis not present

## 2020-04-30 DIAGNOSIS — R29898 Other symptoms and signs involving the musculoskeletal system: Secondary | ICD-10-CM

## 2020-04-30 DIAGNOSIS — G8929 Other chronic pain: Secondary | ICD-10-CM | POA: Diagnosis not present

## 2020-04-30 DIAGNOSIS — M6281 Muscle weakness (generalized): Secondary | ICD-10-CM | POA: Diagnosis not present

## 2020-04-30 NOTE — Therapy (Addendum)
[place], [place], [address] Phone: [phone]   Fax:  [phone]  Physical Therapy Treatment and Discharge  Patient Details  Name: Katie Walker MRN: [id number] Date of Birth: 1956/02/20 Referring Provider (PT): Iran Planas   Encounter Date: 04/30/2020   PT End of Session - 04/30/20 0930    Visit Number 5    Number of Visits 12    Date for PT Re-Evaluation 05/29/20    PT Start Time 0930    PT Stop Time 1008    PT Time Calculation (min) 38 min    Activity Tolerance Patient tolerated treatment well    Behavior During Therapy [facility] for tasks assessed/performed           Past Medical History:  Diagnosis Date  . Breast disease   . Endometrial polyp   . GERD (gastroesophageal reflux disease)   . Uterine fibroid     Past Surgical History:  Procedure Laterality Date  . breast biopsies     fibroadenoma  . BREAST BIOPSY    . BREAST EXCISIONAL BIOPSY    . COLONOSCOPY    . HYSTEROSCOPY      There were no vitals filed for this visit.   Subjective Assessment - 04/30/20 0935    Subjective Pt reports she hasn't had any pain medicine in a few days and has felt good.  She walked 90 min after last session without any issues.    Patient Stated Goals decrease pain and increase activity tolerance    Currently in Pain? No/denies    Pain Score 0-No pain              [facility] PT Assessment - 04/30/20 0001      Assessment   Medical Diagnosis Lumbar DDD, Left flank pain    Referring Provider (PT) Alden Hipp, Luvenia Starch      Strength   Right/Left Hip Right;Left    Right Hip Flexion 5/5    Right Hip Extension 5/5    Right Hip ABduction 5/5    Left Hip Flexion 5/5    Left Hip Extension 5/5    Left Hip ABduction 4+/5            [facility] Adult PT Treatment/Exercise - 04/30/20 0001      Lumbar Exercises: Stretches   Passive Hamstring Stretch Right;Left;1 rep;30 seconds    Hip Flexor Stretch  Right;Left;1 rep;20 seconds    Piriformis Stretch Right;Left;1 rep;20 seconds   seated     Lumbar Exercises: Aerobic   Nustep L5: legs only x 4 min for warm up.      Lumbar Exercises: Sidelying   Other Sidelying Lumbar Exercises open book x 4 reps without band, 4 reps with red band, each side.      Lumbar Exercises: Quadruped   Madcat/Old Horse 5 reps    Madcat/Old Horse Limitations and 5 reps wag the tail (side to side motion in spine)    Opposite Arm/Leg Raise Right arm/Left leg;Left arm/Right leg;5 reps   toe on table   Other Quadruped Lumbar Exercises childs pose x 2 reps, 1 trial with lateral flexion. Thread the needle to reach for sky x 5 reps each side.                  PT Education - 04/30/20 1122    Education Details HEP updated.    Person(s) Educated Patient    Methods Explanation;Handout;Verbal cues;Demonstration    Comprehension Verbalized understanding;Returned demonstration  PT Long Term Goals - 04/30/20 0936      PT LONG TERM GOAL #1   Title Pt will be independent in HEP    Time 6    Period Weeks    Status Achieved      PT LONG TERM GOAL #2   Title Pt will improve FOTO to >=78 to demo improved functional mobility    Baseline FS = 83    Time 6    Period Weeks    Status Achieved      PT LONG TERM GOAL #3   Title Pt will improve bilat hip strength to 4+/5 to be able to perform recreational activities with decreased pain    Time 6    Period Weeks    Status Achieved      PT LONG TERM GOAL #4   Title Pt will perform sit to stand and rolling in bed with pain <= 1/10 without use of medication    Time 6    Period Weeks    Status Achieved                 Plan - 04/30/20 1013    Clinical Impression Statement Introduced a few yoga poses into HEP; tolerated well.  Pt demonstrated improved hip strength and her FOTO score improved.  Pt has met all goals and verbalized readiness to d/c to HEP.    PT Next Visit Plan spoke to  supervising PT; will d/c.    PT Home Exercise Plan [email or web address]    Consulted and Agree with Plan of Care Patient           Patient will benefit from skilled therapeutic intervention in order to improve the following deficits and impairments:     Visit Diagnosis: Chronic bilateral low back pain without sciatica  Muscle weakness (generalized)  Other symptoms and signs involving the musculoskeletal system     Problem List Patient Active Problem List   Diagnosis Date Noted  . Uterine polyp 04/13/2020  . Bloating 04/13/2020  . Osteopenia 08/21/2019  . Dysphagia 12/04/2017  . Cataracts, bilateral 08/18/2017  . Posterior vitreous detachment, bilateral 08/18/2017  . Dermatochalasis of both upper eyelids 08/18/2017  . Disorder of refraction 08/18/2017  . Elevated LDL cholesterol level 12/01/2016  . 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 Abnormal mammogram of both breasts 06/24/2014  . History of colonic polyps 06/24/2014  . Numbness and tingling sensation of skin 06/24/2014   PHYSICAL THERAPY DISCHARGE SUMMARY  Visits from Start of Care: 5  Current functional level related to goals / functional outcomes: Decreased pain and increased strength   Remaining deficits: See above   Education / Equipment: HEP Plan: Patient agrees to discharge.  Patient goals were met. Patient is being discharged due to meeting the stated rehab goals.  ?????    Isabelle Course, PT,DPT03/11/2208:14 AM   Kerin Perna, PTA 04/30/20 11:25 AM  [facility] [place], [place], [address] Phone: [phone]   Fax:  [phone]  Name: Shruthi Northrup MRN: [id number] Date of Birth: September 26, 1955

## 2020-04-30 NOTE — Patient Instructions (Signed)
Access Code: [email or web address] URL: [email or web address] Date: 04/30/2020 Prepared by: [facility]  Exercises Supine Lower Trunk Rotation - 1 x daily - 7 x weekly - 2 sets - 10 reps - 3-5 seconds hold Supine Transversus Abdominis Bracing - Hands on Stomach - 1 x daily - 7 x weekly - 2 sets - 10 reps - 3-5 seconds hold Supine Bridge - 1 x daily - 7 x weekly - 2 sets - 10 reps Seated Hamstring Stretch - 1-2 x daily - 7 x weekly - 1 sets - 2 reps - 15-30 seconds hold Seated Piriformis Stretch - 1-2 x daily - 7 x weekly - 1 sets - 2 reps - 15-30 seconds hold Supine Quadratus Lumborum Stretch - 1 x daily - 7 x weekly - 1 sets - 2 reps - 10 seconds hold Bird Dog - 1 x daily - 2 x weekly - 2 sets - 5 reps - 5 seconds hold Quadruped Full Range Thoracic Rotation with Reach - 1 x daily - 2 x weekly - 5 reps Cat-Camel to Child's Pose - 1 x daily - 7 x weekly - 1 sets - 5 reps Sidelying Thoracic Rotation with Open Book - 1 x daily - 7 x weekly - 1 sets - 5 reps

## 2020-05-06 ENCOUNTER — Telehealth: Payer: Self-pay | Admitting: *Deleted

## 2020-05-06 ENCOUNTER — Ambulatory Visit (INDEPENDENT_AMBULATORY_CARE_PROVIDER_SITE_OTHER): Payer: Medicare PPO

## 2020-05-06 ENCOUNTER — Other Ambulatory Visit: Payer: Self-pay

## 2020-05-06 ENCOUNTER — Ambulatory Visit: Payer: Medicare PPO

## 2020-05-06 DIAGNOSIS — Z1231 Encounter for screening mammogram for malignant neoplasm of breast: Secondary | ICD-10-CM | POA: Diagnosis not present

## 2020-05-06 DIAGNOSIS — Z0000 Encounter for general adult medical examination without abnormal findings: Secondary | ICD-10-CM

## 2020-05-06 DIAGNOSIS — E7800 Pure hypercholesterolemia, unspecified: Secondary | ICD-10-CM

## 2020-05-06 NOTE — Telephone Encounter (Signed)
Labs ordered per pt request for upcoming cpe.

## 2020-05-07 ENCOUNTER — Ambulatory Visit: Payer: Medicare PPO

## 2020-05-07 LAB — COMPLETE METABOLIC PANEL WITH GFR
AG Ratio: 2 (calc) (ref 1.0–2.5)
ALT: 21 U/L (ref 6–29)
AST: 18 U/L (ref 10–35)
Albumin: 4.7 g/dL (ref 3.6–5.1)
Alkaline phosphatase (APISO): 71 U/L (ref 37–153)
BUN: 19 mg/dL (ref 7–25)
CO2: 31 mmol/L (ref 20–32)
Calcium: 10.2 mg/dL (ref 8.6–10.4)
Chloride: 101 mmol/L (ref 98–110)
Creat: 0.87 mg/dL (ref 0.50–0.99)
GFR, Est African American: 81 mL/min/{1.73_m2} (ref >=60)
GFR, Est Non African American: 70 mL/min/{1.73_m2} (ref >=60)
Globulin: 2.3 g/dL (calc) (ref 1.9–3.7)
Glucose, Bld: 90 mg/dL (ref 65–99)
Potassium: 4 mmol/L (ref 3.5–5.3)
Sodium: 141 mmol/L (ref 135–146)
Total Bilirubin: 0.5 mg/dL (ref 0.2–1.2)
Total Protein: 7 g/dL (ref 6.1–8.1)

## 2020-05-07 LAB — CBC WITH DIFFERENTIAL/PLATELET
Absolute Monocytes: 351 cells/uL (ref 200–950)
Basophils Absolute: 41 cells/uL (ref 0–200)
Basophils Relative: 0.9 %
Eosinophils Absolute: 230 cells/uL (ref 15–500)
Eosinophils Relative: 5.1 %
HCT: 42.3 % (ref 35.0–45.0)
Hemoglobin: 13.7 g/dL (ref 11.7–15.5)
Lymphs Abs: 1706 cells/uL (ref 850–3900)
MCH: 28.1 pg (ref 27.0–33.0)
MCHC: 32.4 g/dL (ref 32.0–36.0)
MCV: 86.7 fL (ref 80.0–100.0)
MPV: 10.3 fL (ref 7.5–12.5)
Monocytes Relative: 7.8 %
Neutro Abs: 2174 cells/uL (ref 1500–7800)
Neutrophils Relative %: 48.3 %
Platelets: 220 10*3/uL (ref 140–400)
RBC: 4.88 10*6/uL (ref 3.80–5.10)
RDW: 12.8 % (ref 11.0–15.0)
Total Lymphocyte: 37.9 %
WBC: 4.5 10*3/uL (ref 3.8–10.8)

## 2020-05-07 LAB — LIPID PANEL W/REFLEX DIRECT LDL
Cholesterol: 272 mg/dL — ABNORMAL HIGH (ref <200)
HDL: 75 mg/dL (ref >=50)
LDL Cholesterol (Calc): 168 mg/dL (calc) — ABNORMAL HIGH
Non-HDL Cholesterol (Calc): 197 mg/dL (calc) — ABNORMAL HIGH (ref <130)
Total CHOL/HDL Ratio: 3.6 (calc) (ref <5.0)
Triglycerides: 143 mg/dL (ref <150)

## 2020-05-08 ENCOUNTER — Encounter: Payer: Self-pay | Admitting: Physician Assistant

## 2020-05-08 NOTE — Progress Notes (Signed)
Normal mammogram. Follow up in 1 year.

## 2020-05-08 NOTE — Telephone Encounter (Signed)
Marilee,   Kidney, liver, glucose look great.  Hemoglobin and WBC look great.  LDL, bad cholesterol, is up some. Your overall 10 year CV risk is 7 percent. Consider a low dose statin for better CV risk reduction. You do have an amazing HDL which is great for you. Thoughts?

## 2020-05-11 ENCOUNTER — Encounter: Payer: Medicare PPO | Admitting: Physician Assistant

## 2020-05-11 NOTE — Telephone Encounter (Signed)
Pt has already viewed mammogram results on her mychart.

## 2020-05-15 ENCOUNTER — Encounter: Payer: Medicare PPO | Admitting: Physician Assistant

## 2020-05-15 ENCOUNTER — Ambulatory Visit (INDEPENDENT_AMBULATORY_CARE_PROVIDER_SITE_OTHER): Payer: Medicare PPO | Admitting: Physician Assistant

## 2020-05-15 ENCOUNTER — Other Ambulatory Visit: Payer: Self-pay

## 2020-05-15 ENCOUNTER — Encounter: Payer: Self-pay | Admitting: Physician Assistant

## 2020-05-15 VITALS — BP 133/70 | HR 68 | Ht 64.0 in | Wt 140.0 lb

## 2020-05-15 DIAGNOSIS — E7800 Pure hypercholesterolemia, unspecified: Secondary | ICD-10-CM | POA: Diagnosis not present

## 2020-05-15 DIAGNOSIS — Z0000 Encounter for general adult medical examination without abnormal findings: Secondary | ICD-10-CM

## 2020-05-15 DIAGNOSIS — M8580 Other specified disorders of bone density and structure, unspecified site: Secondary | ICD-10-CM

## 2020-05-15 MED ORDER — ROSUVASTATIN CALCIUM 5 MG PO TABS: ORAL_TABLET | ORAL | 1 refills | Status: DC

## 2020-05-15 NOTE — Patient Instructions (Signed)

## 2020-05-18 ENCOUNTER — Encounter: Payer: Self-pay | Admitting: Physician Assistant

## 2020-05-18 NOTE — Progress Notes (Signed)
Subjective:     Katie Walker is a 65 y.o. female and is here for a comprehensive physical exam. The patient reports problems - discuss labs especially her elevated cholesterol level and starting a statin. .  Social History   Socioeconomic History  . Marital status: Married    Spouse name: Ray "Tax inspector  . Number of children: 2  . Years of education: Bachelor's  . Highest education level: Not on file  Occupational History  . Occupation: homemaker  Tobacco Use  . Smoking status: Never Smoker  . Smokeless tobacco: Never Used  Substance and Sexual Activity  . Alcohol use: No    Alcohol/week: 0.0 standard drinks  . Drug use: No  . Sexual activity: Yes    Partners: Male    Birth control/protection: None    Comment: Partner: Male  Other Topics Concern  . Not on file  Social History Narrative  . Not on file   Social Determinants of Health   Financial Resource Strain: Not on file  Food Insecurity: Not on file  Transportation Needs: Not on file  Physical Activity: Not on file  Stress: Not on file  Social Connections: Not on file  Intimate Partner Violence: Not on file   Health Maintenance  Topic Date Due  . HIV Screening  11/22/2025 (Originally 06/14/1970)  . COVID-19 Vaccine (4 - Booster for Kiester series) 08/03/2020  . PNA vac Low Risk Adult (2 of 2 - PPSV23) 08/14/2020  . MAMMOGRAM  05/07/2022  . PAP SMEAR-Modifier  05/10/2022  . COLONOSCOPY (Pts 45-28yrs Insurance coverage will need to be confirmed)  10/01/2023  . TETANUS/TDAP  11/11/2024  . INFLUENZA VACCINE  Completed  . DEXA SCAN  Completed  . Hepatitis C Screening  Completed  . HPV VACCINES  Aged Out    The following portions of the patient's history were reviewed and updated as appropriate: allergies, current medications, past family history, past medical history, past social history, past surgical history and problem list.  Review of Systems Pertinent items noted in HPI and remainder of comprehensive  ROS otherwise negative.   Objective:    BP 133/70   Pulse 68   Ht 5\' 4"  (1.626 m)   Wt 140 lb (63.5 kg)   SpO2 98%   BMI 24.03 kg/m  General appearance: alert, cooperative and appears stated age Head: Normocephalic, without obvious abnormality, atraumatic Eyes: conjunctivae/corneas clear. PERRL, EOM's intact. Fundi benign. Ears: normal TM's and external ear canals both ears Nose: Nares normal. Septum midline. Mucosa normal. No drainage or sinus tenderness. Throat: lips, mucosa, and tongue normal; teeth and gums normal Neck: no adenopathy, no carotid bruit, no JVD, supple, symmetrical, trachea midline and thyroid not enlarged, symmetric, no tenderness/mass/nodules Back: symmetric, no curvature. ROM normal. No CVA tenderness. Lungs: clear to auscultation bilaterally Heart: regular rate and rhythm, S1, S2 normal, no murmur, click, rub or gallop Abdomen: soft, non-tender; bowel sounds normal; no masses,  no organomegaly Extremities: extremities normal, atraumatic, no cyanosis or edema Pulses: 2+ and symmetric Skin: Skin color, texture, turgor normal. No rashes or lesions Lymph nodes: Cervical, supraclavicular, and axillary nodes normal. Neurologic: Alert and oriented X 3, normal strength and tone. Normal symmetric reflexes. Normal coordination and gait   .Katie Walker Depression screen [facility] 2/9 05/15/2020 05/10/2019 11/30/2017 11/29/2016  Decreased Interest 0 0 0 0  Down, Depressed, Hopeless 0 0 0 0  PHQ - 2 Score 0 0 0 0  Altered sleeping - 2 1 -  Tired, decreased energy - 0  0 -  Change in appetite - 0 0 -  Feeling bad or failure about yourself  - 0 0 -  Trouble concentrating - 0 0 -  Moving slowly or fidgety/restless - 0 0 -  Suicidal thoughts - 0 0 -  PHQ-9 Score - 2 1 -  Difficult doing work/chores - Not difficult at all Not difficult at all -  .   Assessment:    Healthy female exam.      Plan:    Katie KitchenMarland KitchenMerilee was seen today for annual exam.  Diagnoses and all orders for this  visit:  Routine physical examination  Elevated LDL cholesterol level -     rosuvastatin (CRESTOR) 5 MG tablet; Take one tablet every other day.  Osteopenia, unspecified location   .Katie Walker Discussed 150 minutes of exercise a week.  Encouraged vitamin D 1000 units and Calcium 1300mg  or 4 servings of dairy a day.  PHQ great. Fasting labs discussed.  Elevated LDL. 10 year CV risk 7 percent. Discussed due to age encouraged a statin. Pt agreed. crestor risk vs benefits discussed. Started every other day. Recheck in 4-6 months LDL.  Pap UTD. Mammogram UTD. Colonoscopy UTD. Shingles/covid/flu/pneumonia vaccine UTD.   Follow up in 6 months.  See After Visit Summary for Counseling Recommendations

## 2020-09-11 LAB — HM DIABETES EYE EXAM

## 2020-09-29 ENCOUNTER — Ambulatory Visit (INDEPENDENT_AMBULATORY_CARE_PROVIDER_SITE_OTHER): Payer: Medicare PPO | Admitting: Physician Assistant

## 2020-09-29 DIAGNOSIS — Z0000 Encounter for general adult medical examination without abnormal findings: Secondary | ICD-10-CM

## 2020-09-29 NOTE — Patient Instructions (Signed)
[facility] Maintenance Summary and Written Plan of Care  Katie Walker ,  Thank you for allowing me to perform your Medicare Annual Wellness Visit and for your ongoing commitment to your health.   Health Maintenance & Immunization History Health Maintenance  Topic Date Due   COVID-19 Vaccine (4 - Booster for Pfizer series) 10/15/2020 (Originally 05/04/2020)   PNA vac Low Risk Adult (2 of 2 - PPSV23) 11/04/2020 (Originally 08/14/2020)   INFLUENZA VACCINE  10/05/2020   MAMMOGRAM  05/07/2022   DEXA SCAN  08/21/2022   COLONOSCOPY (Pts 45-72yr Insurance coverage will need to be confirmed)  10/01/2023   TETANUS/TDAP  11/11/2024   Hepatitis C Screening  Completed   Zoster Vaccines- Shingrix  Completed   HPV VACCINES  Aged Out   Immunization History  Administered Date(s) Administered   Influenza,inj,Quad PF,6+ Mos 02/24/2020   PFIZER(Purple Top)SARS-COV-2 Vaccination 06/18/2019, 07/09/2019, 02/04/2020   Pneumococcal Conjugate-13 08/15/2019   Tdap 06/24/2014, 11/12/2014   Zoster Recombinat (Shingrix) 11/29/2016, 01/30/2017   Zoster, Live 06/24/2014    These are the patient goals that we discussed:  Goals Addressed               This Visit's Progress     Patient Stated (pt-stated)        09/29/2020 AWV Goal: Improved Nutrition/Diet  Patient will verbalize understanding that diet plays an important role in overall health and that a poor diet is a risk factor for many chronic medical conditions.  Over the next year, patient will improve self management of their diet by incorporating better variety. Patient will utilize available community resources to help with food acquisition if needed (ex: food pantries, [address], etc) Patient will work with nutrition specialist if a referral was made          This is a list of Health Maintenance Items that are overdue or due now: There are no preventive care reminders to display for this patient.    Orders/Referrals Placed Today: No orders of the defined types were placed in this encounter.  (Contact our referral department at [phone] you have not spoken with someone about your referral appointment within the next 5 days)    Follow-up Plan Follow-up with BDonella Stade PA-C as planned Pneumonia vaccine can be done at your next in-office appointment.  Medicare wellness visit in one year. Patient will access AVS on my chart.     Health Maintenance, Female Adopting a healthy lifestyle and getting preventive care are important in promoting health and wellness. Ask your health care provider about: The right schedule for you to have regular tests and exams. Things you can do on your own to prevent diseases and keep yourself healthy. What should I know about diet, weight, and exercise? Eat a healthy diet  Eat a diet that includes plenty of vegetables, fruits, low-fat dairy products, and lean protein. Do not eat a lot of foods that are high in solid fats, added sugars, or sodium.  Maintain a healthy weight Body mass index (BMI) is used to identify weight problems. It estimates body fat based on height and weight. Your health care provider can help determineyour BMI and help you achieve or maintain a healthy weight. Get regular exercise Get regular exercise. This is one of the most important things you can do for your health. Most adults should: Exercise for at least 150 minutes each week. The exercise should increase your heart rate and make you sweat (moderate-intensity exercise). Do  strengthening exercises at least twice a week. This is in addition to the moderate-intensity exercise. Spend less time sitting. Even light physical activity can be beneficial. Watch cholesterol and blood lipids Have your blood tested for lipids and cholesterol at 66 years of age, then havethis test every 5 years. Have your cholesterol levels checked more often if: Your lipid or cholesterol  levels are high. You are older than 66 years of age. You are at high risk for heart disease. What should I know about cancer screening? Depending on your health history and family history, you may need to have cancer screening at various ages. This may include screening for: Breast cancer. Cervical cancer. Colorectal cancer. Skin cancer. Lung cancer. What should I know about heart disease, diabetes, and high blood pressure? Blood pressure and heart disease High blood pressure causes heart disease and increases the risk of stroke. This is more likely to develop in people who have high blood pressure readings, are of African descent, or are overweight. Have your blood pressure checked: Every 3-5 years if you are 77-28 years of age. Every year if you are 84 years old or older. Diabetes Have regular diabetes screenings. This checks your fasting blood sugar level. Have the screening done: Once every three years after age 53 if you are at a normal weight and have a low risk for diabetes. More often and at a younger age if you are overweight or have a high risk for diabetes. What should I know about preventing infection? Hepatitis B If you have a higher risk for hepatitis B, you should be screened for this virus. Talk with your health care provider to find out if you are at risk forhepatitis B infection. Hepatitis C Testing is recommended for: Everyone born from [birth year] through 1966. Anyone with known risk factors for hepatitis C. Sexually transmitted infections (STIs) Get screened for STIs, including gonorrhea and chlamydia, if: You are sexually active and are younger than 66 years of age. You are older than 66 years of age and your health care provider tells you that you are at risk for this type of infection. Your sexual activity has changed since you were last screened, and you are at increased risk for chlamydia or gonorrhea. Ask your health care provider if you are at risk. Ask your  health care provider about whether you are at high risk for HIV. Your health care provider may recommend a prescription medicine to help prevent HIV infection. If you choose to take medicine to prevent HIV, you should first get tested for HIV. You should then be tested every 3 months for as long as you are taking the medicine. Pregnancy If you are about to stop having your period (premenopausal) and you may become pregnant, seek counseling before you get pregnant. Take 400 to 800 micrograms (mcg) of folic acid every day if you become pregnant. Ask for birth control (contraception) if you want to prevent pregnancy. Osteoporosis and menopause Osteoporosis is a disease in which the bones lose minerals and strength with aging. This can result in bone fractures. If you are 64 years old or older, or if you are at risk for osteoporosis and fractures, ask your health care provider if you should: Be screened for bone loss. Take a calcium or vitamin D supplement to lower your risk of fractures. Be given hormone replacement therapy (HRT) to treat symptoms of menopause. Follow these instructions at home: Lifestyle Do not use any products that contain nicotine or tobacco, such as  cigarettes, e-cigarettes, and chewing tobacco. If you need help quitting, ask your health care provider. Do not use street drugs. Do not share needles. Ask your health care provider for help if you need support or information about quitting drugs. Alcohol use Do not drink alcohol if: Your health care provider tells you not to drink. You are pregnant, may be pregnant, or are planning to become pregnant. If you drink alcohol: Limit how much you use to 0-1 drink a day. Limit intake if you are breastfeeding. Be aware of how much alcohol is in your drink. In the [place] one drink equals one 12 oz bottle of beer (355 mL), one 5 oz glass of wine (148 mL), or one 1 oz glass of hard liquor (44 mL). General instructions Schedule regular  health, dental, and eye exams. Stay current with your vaccines. Tell your health care provider if: You often feel depressed. You have ever been abused or do not feel safe at home. Summary Adopting a healthy lifestyle and getting preventive care are important in promoting health and wellness. Follow your health care provider's instructions about healthy diet, exercising, and getting tested or screened for diseases. Follow your health care provider's instructions on monitoring your cholesterol and blood pressure. This information is not intended to replace advice given to you by your health care provider. Make sure you discuss any questions you have with your healthcare provider. Document Revised: 02/14/2018 Document Reviewed: 02/14/2018 Elsevier Patient Education  2022 [employer].

## 2020-09-29 NOTE — Progress Notes (Signed)
MEDICARE ANNUAL WELLNESS VISIT  09/29/2020  Telephone Visit Disclaimer This Medicare AWV was conducted by telephone due to national recommendations for restrictions regarding the COVID-19 Pandemic (e.g. social distancing).  I verified, using two identifiers, that I am speaking with Katie Walker or their authorized healthcare agent. I discussed the limitations, risks, security, and privacy concerns of performing an evaluation and management service by telephone and the potential availability of an in-person appointment in the future. The patient expressed understanding and agreed to proceed.  Location of Patient: Home Location of Provider (nurse):  In the office.  Subjective:    Katie Walker is a 65 y.o. female patient of Alden Hipp, Royetta Car, PA-C who had a [employer] Visit today via telephone. [name] is Retired and lives with their spouse. she has 2 children. she reports that she is socially active and does interact with friends/family regularly. she is moderately physically active and enjoys reading, spending time with grand children, walking and traveling.  Patient Care Team: Lavada Mesi as PCP - General (Family Medicine)  Advanced Directives 09/29/2020 06/24/2014  Does Patient Have a Medical Advance Directive? Yes No  Type of Advance Directive Living will;Healthcare Power of Attorney -  Does patient want to make changes to medical advance directive? No - Patient declined -  Copy of [place] in Chart? No - copy requested -  Would patient like information on creating a medical advance directive? - Yes - Educational materials given    Hospital Utilization Over the Past 12 Months: # of hospitalizations or ER visits: 0 # of surgeries: 0  Review of Systems    Patient reports that her overall health is unchanged compared to last year.  History obtained from chart review and the patient  Patient Reported Readings (BP, Pulse, CBG,  Weight, etc) none  Pain Assessment Pain : No/denies pain     Current Medications & Allergies (verified) Allergies as of 09/29/2020   No Known Allergies      Medication List        Accurate as of September 29, 2020  9:21 AM. If you have any questions, ask your nurse or doctor.          b complex vitamins tablet Take 1 tablet by mouth daily.   CALCIUM + D PO Take by mouth.   Cinnamon 500 MG capsule Take 2,000 mg by mouth daily.   Coconut Oil 1000 MG Caps Take 1,000 mg by mouth daily.   famotidine 20 MG tablet Commonly known as: PEPCID Take 20 mg by mouth 2 (two) times daily.   HM SUPER VITAMIN B12 PO Take by mouth.   MULTI VITAMIN DAILY PO Take by mouth.   OMEGA 3 PO Take by mouth.   rosuvastatin 5 MG tablet Commonly known as: Crestor Take one tablet every other day.        History (reviewed): Past Medical History:  Diagnosis Date   Arthritis Feb. 8, 2022   Breast disease    Endometrial polyp    GERD (gastroesophageal reflux disease)    Uterine fibroid    Past Surgical History:  Procedure Laterality Date   breast biopsies     fibroadenoma   BREAST BIOPSY Right    BREAST EXCISIONAL BIOPSY Right 1993   BREAST EXCISIONAL BIOPSY Left 1998   BREAST EXCISIONAL BIOPSY Right 2005   COLONOSCOPY     HYSTEROSCOPY     Family History  Problem Relation Age of Onset   Colon  cancer Father    Cancer Father    Thyroid cancer Sister    Breast cancer Paternal Grandmother    Cancer Paternal Grandmother    Colon cancer Paternal Grandfather        bone   Cancer Paternal Grandfather    Cancer Maternal Grandmother        bowel   Heart disease Maternal Grandfather    Cancer Sister    Social History   Socioeconomic History   Marital status: Married    Spouse name: Carloyn Manner "Buzz" Wessler   Number of children: 2   Years of education: 16   Highest education level: Bachelor's degree (e.g., BA, AB, BS)  Occupational History   Occupation: homemaker  Tobacco  Use   Smoking status: Never   Smokeless tobacco: Never  Substance and Sexual Activity   Alcohol use: Not Currently    Comment: 1 glass of wine twice a month   Drug use: No   Sexual activity: Yes    Partners: Male    Birth control/protection: None    Comment: Partner: Male  Other Topics Concern   Not on file  Social History Narrative   Lives with her husband. Enjoys reading, traveling and walking.   Goes to the [facility], twice a week and walks a few times a week.    Social Determinants of Health   Financial Resource Strain: Low Risk    Difficulty of Paying Living Expenses: Not hard at all  Food Insecurity: No Food Insecurity   Worried About Charity fundraiser in the Last Year: Never true   [place] in the Last Year: Never true  Transportation Needs: No Transportation Needs   Lack of Transportation (Medical): No   Lack of Transportation (Non-Medical): No  Physical Activity: Sufficiently Active   Days of Exercise per Week: 4 days   Minutes of Exercise per Session: 50 min  Stress: No Stress Concern Present   Feeling of Stress : Not at all  Social Connections: Socially Integrated   Frequency of Communication with Friends and Family: More than three times a week   Frequency of Social Gatherings with Friends and Family: More than three times a week   Attends Religious Services: More than 4 times per year   Active Member of [employer] or Organizations: Yes   Attends Archivist Meetings: More than 4 times per year   Marital Status: Married    Activities of Daily Living In your present state of health, do you have any difficulty performing the following activities: 09/29/2020  Hearing? N  Vision? N  Difficulty concentrating or making decisions? N  Walking or climbing stairs? N  Dressing or bathing? N  Doing errands, shopping? N  Preparing Food and eating ? N  Using the Toilet? N  In the past six months, have you accidently leaked urine? N  Do you have problems with  loss of bowel control? N  Managing your Medications? N  Managing your Finances? N  Housekeeping or managing your Housekeeping? N  Some recent data might be hidden    Patient Education/ Literacy How often do you need to have someone help you when you read instructions, pamphlets, or other written materials from your doctor or pharmacy?: 1 - Never What is the last grade level you completed in school?: Bachelor's Degree  Exercise Current Exercise Habits: Structured exercise class, Type of exercise: walking;Other - see comments (line dancing, cardio), Time (Minutes): 45, Frequency (Times/Week): 4, Weekly Exercise (Minutes/Week):  180, Intensity: Moderate, Exercise limited by: None identified  Diet Patient reports consuming 3 meals a day and 0 snack(s) a day Patient reports that her primary diet is: Regular Patient reports that she does have regular access to food.   Depression Screen PHQ 2/9 Scores 09/29/2020 05/15/2020 05/10/2019 11/30/2017 11/29/2016  PHQ - 2 Score 0 0 0 0 0  PHQ- 9 Score 0 - 2 1 -     Fall Risk Fall Risk  09/29/2020 05/15/2020 05/10/2019  Falls in the past year? 0 0 0  Number falls in past yr: 0 0 -  Injury with Fall? 0 0 -  Risk for fall due to : No Fall Risks - -  Follow up Falls evaluation completed Falls evaluation completed -     Objective:  Katie Walker seemed alert and oriented and she participated appropriately during our telephone visit.  Blood Pressure Weight BMI  BP Readings from Last 3 Encounters:  05/15/20 133/70  04/13/20 (!) 147/70  05/10/19 138/71   Wt Readings from Last 3 Encounters:  05/15/20 140 lb (63.5 kg)  04/13/20 139 lb (63 kg)  05/10/19 137 lb (62.1 kg)   BMI Readings from Last 1 Encounters:  05/15/20 24.03 kg/m    *Unable to obtain current vital signs, weight, and BMI due to telephone visit type  Hearing/Vision  Grazia did not seem to have difficulty with hearing/understanding during the telephone conversation Reports that she  has had a formal eye exam by an eye care professional within the past year Reports that she has not had a formal hearing evaluation within the past year *Unable to fully assess hearing and vision during telephone visit type  Cognitive Function: 6CIT Screen 09/29/2020  What Year? 0 points  What month? 0 points  What time? 0 points  Count back from 20 0 points  Months in reverse 0 points  Repeat phrase 0 points  Total Score 0   (Normal:0-7, Significant for Dysfunction: >8)  Normal Cognitive Function Screening: Yes   Immunization & Health Maintenance Record Immunization History  Administered Date(s) Administered   Influenza,inj,Quad PF,6+ Mos 02/24/2020   PFIZER(Purple Top)SARS-COV-2 Vaccination 06/18/2019, 07/09/2019, 02/04/2020   Pneumococcal Conjugate-13 08/15/2019   Tdap 06/24/2014, 11/12/2014   Zoster Recombinat (Shingrix) 11/29/2016, 01/30/2017   Zoster, Live 06/24/2014    Health Maintenance  Topic Date Due   COVID-19 Vaccine (4 - Booster for [place] series) 10/15/2020 (Originally 05/04/2020)   PNA vac Low Risk Adult (2 of 2 - PPSV23) 11/04/2020 (Originally 08/14/2020)   INFLUENZA VACCINE  10/05/2020   MAMMOGRAM  05/07/2022   DEXA SCAN  08/21/2022   COLONOSCOPY (Pts 45-62yr Insurance coverage will need to be confirmed)  10/01/2023   TETANUS/TDAP  11/11/2024   Hepatitis C Screening  Completed   Zoster Vaccines- Shingrix  Completed   HPV VACCINES  Aged Out       Assessment  This is a routine wellness examination for [employer]  Health Maintenance: Due or Overdue There are no preventive care reminders to display for this patient.   MRosemarie Beathdoes not need a referral for Community Assistance: Care Management:   no Social Work:    no Prescription Assistance:  no Nutrition/Diabetes Education:  no   Plan:  Personalized Goals  Goals Addressed               This Visit's Progress     Patient Stated (pt-stated)        09/29/2020 AWV Goal:  Improved Nutrition/Diet  Patient will  verbalize understanding that diet plays an important role in overall health and that a poor diet is a risk factor for many chronic medical conditions.  Over the next year, patient will improve self management of their diet by incorporating better variety. Patient will utilize available community resources to help with food acquisition if needed (ex: food pantries, [address], etc) Patient will work with nutrition specialist if a referral was made        Personalized Health Maintenance & Screening Recommendations  Pneumococcal vaccine   Lung Cancer Screening Recommended: no (Low Dose CT Chest recommended if Age 28-80 years, 30 pack-year currently smoking OR have quit w/in past 15 years) Hepatitis C Screening recommended: no HIV Screening recommended: no  Advanced Directives: Written information was not prepared per patient's request.  Referrals & Orders No orders of the defined types were placed in this encounter.   Follow-up Plan Follow-up with Donella Stade, PA-C as planned Pneumonia vaccine can be done at your next in-office appointment.  Medicare wellness visit in one year. Patient will access AVS on my chart.   I have personally reviewed and noted the following in the patient's chart:   Medical and social history Use of alcohol, tobacco or illicit drugs  Current medications and supplements Functional ability and status Nutritional status Physical activity Advanced directives List of other physicians Hospitalizations, surgeries, and ER visits in previous 12 months Vitals Screenings to include cognitive, depression, and falls Referrals and appointments  In addition, I have reviewed and discussed with Katie Walker certain preventive protocols, quality metrics, and best practice recommendations. A written personalized care plan for preventive services as well as general preventive health recommendations is available and can be  mailed to the patient at her request.      Tinnie Gens, RN  09/29/2020

## 2020-11-02 ENCOUNTER — Telehealth: Payer: Self-pay | Admitting: Neurology

## 2020-11-02 DIAGNOSIS — E7800 Pure hypercholesterolemia, unspecified: Secondary | ICD-10-CM

## 2020-11-02 NOTE — Telephone Encounter (Signed)
Patient left vm wanting to have the order for her cholesterol placed before her appt to have this rechecked so it can be discussed. It does not look like she is due for any other labs. Anything else need ordered?

## 2020-11-02 NOTE — Telephone Encounter (Signed)
Just cholesterol. Ordered signed.

## 2020-11-02 NOTE — Telephone Encounter (Signed)
Patient made aware.

## 2020-11-06 ENCOUNTER — Encounter: Payer: Self-pay | Admitting: Physician Assistant

## 2020-11-06 LAB — LIPID PANEL W/REFLEX DIRECT LDL
Cholesterol: 219 mg/dL — ABNORMAL HIGH (ref <200)
HDL: 76 mg/dL (ref >=50)
LDL Cholesterol (Calc): 124 mg/dL (calc) — ABNORMAL HIGH
Non-HDL Cholesterol (Calc): 143 mg/dL (calc) — ABNORMAL HIGH (ref <130)
Total CHOL/HDL Ratio: 2.9 (calc) (ref <5.0)
Triglycerides: 88 mg/dL (ref <150)

## 2020-11-06 NOTE — Progress Notes (Signed)
Jacquel,   Cholesterol is much better but not to goal. I would like for you to be under 100. How are you tolerating the crestor every other day? I would like to see if we could increase dose or frequency just a tad. What are your thoughts?

## 2020-11-10 ENCOUNTER — Ambulatory Visit: Payer: Medicare PPO | Admitting: Physician Assistant

## 2020-11-16 ENCOUNTER — Ambulatory Visit: Payer: Medicare PPO | Admitting: Physician Assistant

## 2020-11-24 ENCOUNTER — Encounter: Payer: Self-pay | Admitting: Physician Assistant

## 2020-11-25 NOTE — Telephone Encounter (Signed)
Patient has been scheduled for the flu shot & pneumonia 23 . AM

## 2020-12-01 ENCOUNTER — Ambulatory Visit (INDEPENDENT_AMBULATORY_CARE_PROVIDER_SITE_OTHER): Payer: Medicare PPO | Admitting: Physician Assistant

## 2020-12-01 VITALS — BP 137/77 | HR 69 | Resp 20 | Ht 64.0 in | Wt 140.0 lb

## 2020-12-01 DIAGNOSIS — Z23 Encounter for immunization: Secondary | ICD-10-CM | POA: Diagnosis not present

## 2020-12-01 NOTE — Progress Notes (Signed)
Patient ID: Katie Walker, female   DOB: [birth year], 65 y.o.   MRN: [id number] Agree with above plan.

## 2020-12-01 NOTE — Progress Notes (Signed)
Established Patient Office Visit  Subjective:  Patient ID: Katie Walker, female    DOB: Dec 12, 1955  Age: 65 y.o. MRN: [id number]  CC:  Chief Complaint  Patient presents with   Immunizations    HPI Katie Walker presents for vaccines. High dose flu vaccine and Pneumovax 23 given. Flu given in left deltoid, Pneumovax given in right deltoid. Pt tolerated well.  Past Medical History:  Diagnosis Date   Arthritis Feb. 8, 2022   Breast disease    Endometrial polyp    GERD (gastroesophageal reflux disease)    Uterine fibroid     Past Surgical History:  Procedure Laterality Date   breast biopsies     fibroadenoma   BREAST BIOPSY Right    BREAST EXCISIONAL BIOPSY Right 1993   BREAST EXCISIONAL BIOPSY Left 1998   BREAST EXCISIONAL BIOPSY Right 2005   COLONOSCOPY     HYSTEROSCOPY      Family History  Problem Relation Age of Onset   Colon cancer Father    Cancer Father    Thyroid cancer Sister    Breast cancer Paternal Grandmother    Cancer Paternal Grandmother    Colon cancer Paternal Grandfather        bone   Cancer Paternal Grandfather    Cancer Maternal Grandmother        bowel   Heart disease Maternal Grandfather    Cancer Sister     Social History   Socioeconomic History   Marital status: Married    Spouse name: Carloyn Manner "Buzz" Susan   Number of children: 2   Years of education: 16   Highest education level: Bachelor's degree (e.g., BA, AB, BS)  Occupational History   Occupation: homemaker  Tobacco Use   Smoking status: Never   Smokeless tobacco: Never  Substance and Sexual Activity   Alcohol use: Not Currently    Comment: 1 glass of wine twice a month   Drug use: No   Sexual activity: Yes    Partners: Male    Birth control/protection: None    Comment: Partner: Male  Other Topics Concern   Not on file  Social History Narrative   Lives with her husband. Enjoys reading, spending time with grandchildren, traveling and walking.   [facility] to the  [facility], twice a week and walks a few times a week.    Social Determinants of Health   Financial Resource Strain: Low Risk    Difficulty of Paying Living Expenses: Not hard at all  Food Insecurity: No Food Insecurity   Worried About Charity fundraiser in the Last Year: Never true   [place] in the Last Year: Never true  Transportation Needs: No Transportation Needs   Lack of Transportation (Medical): No   Lack of Transportation (Non-Medical): No  Physical Activity: Sufficiently Active   Days of Exercise per Week: 4 days   Minutes of Exercise per Session: 50 min  Stress: No Stress Concern Present   Feeling of Stress : Not at all  Social Connections: Socially Integrated   Frequency of Communication with Friends and Family: More than three times a week   Frequency of Social Gatherings with Friends and Family: More than three times a week   Attends Religious Services: More than 4 times per year   Active Member of [employer] or Organizations: Yes   Attends Music therapist: More than 4 times per year   Marital Status: Married  Human resources officer Violence: Not At Risk  Fear of Current or Ex-Partner: No   Emotionally Abused: No   Physically Abused: No   Sexually Abused: No    Outpatient Medications Prior to Visit  Medication Sig Dispense Refill   b complex vitamins tablet Take 1 tablet by mouth daily.     Calcium Carbonate-Vitamin D (CALCIUM + D PO) Take by mouth.     Cyanocobalamin (HM SUPER VITAMIN B12 PO) Take by mouth.     famotidine (PEPCID) 20 MG tablet Take 20 mg by mouth 2 (two) times daily.     Multiple Vitamin (MULTI VITAMIN DAILY PO) Take by mouth.     Omega-3 Fatty Acids (OMEGA 3 PO) Take by mouth.     rosuvastatin (CRESTOR) 5 MG tablet Take one tablet every other day. 90 tablet 1   No facility-administered medications prior to visit.    No Known Allergies  ROS Review of Systems    Objective:    Physical Exam  BP 137/77 (BP Location: Left Arm,  Patient Position: Sitting, Cuff Size: Normal)   Pulse 69   Resp 20   Ht 5\' 4"  (1.626 m)   Wt 140 lb (63.5 kg)   SpO2 98%   BMI 24.03 kg/m  Wt Readings from Last 3 Encounters:  12/01/20 140 lb (63.5 kg)  05/15/20 140 lb (63.5 kg)  04/13/20 139 lb (63 kg)     Health Maintenance Due  Topic Date Due   COVID-19 Vaccine (4 - Booster for Pfizer series) 04/28/2020    There are no preventive care reminders to display for this patient.  Lab Results  Component Value Date   TSH 1.05 11/30/2017   Lab Results  Component Value Date   WBC 4.5 05/07/2020   HGB 13.7 05/07/2020   HCT 42.3 05/07/2020   MCV 86.7 05/07/2020   PLT 220 05/07/2020   Lab Results  Component Value Date   NA 141 05/07/2020   K 4.0 05/07/2020   CO2 31 05/07/2020   GLUCOSE 90 05/07/2020   BUN 19 05/07/2020   CREATININE 0.87 05/07/2020   BILITOT 0.5 05/07/2020   ALKPHOS 71 11/16/2015   AST 18 05/07/2020   ALT 21 05/07/2020   PROT 7.0 05/07/2020   ALBUMIN 4.4 11/16/2015   CALCIUM 10.2 05/07/2020   Lab Results  Component Value Date   CHOL 219 (H) 11/05/2020   Lab Results  Component Value Date   HDL 76 11/05/2020   Lab Results  Component Value Date   LDLCALC 124 (H) 11/05/2020   Lab Results  Component Value Date   TRIG 88 11/05/2020   Lab Results  Component Value Date   CHOLHDL 2.9 11/05/2020   No results found for: HGBA1C    Assessment & Plan:  Flu given in left deltoid, Pneumovax given in right deltoid. Pt tolerated well with no apparent complications. Problem List Items Addressed This Visit   None Visit Diagnoses     Flu vaccine need    -  Primary   Relevant Orders   Flu Vaccine QUAD High Dose(Fluad) (Completed)   Need for pneumococcal vaccination       Relevant Orders   Pneumococcal polysaccharide vaccine 23-valent greater than or equal to 2yo subcutaneous/IM (Completed)       No orders of the defined types were placed in this encounter.   Follow-up: No follow-ups on file.     Ninfa Meeker, CMA

## 2021-03-29 ENCOUNTER — Other Ambulatory Visit: Payer: Self-pay | Admitting: Physician Assistant

## 2021-03-29 DIAGNOSIS — Z1231 Encounter for screening mammogram for malignant neoplasm of breast: Secondary | ICD-10-CM

## 2021-05-12 ENCOUNTER — Other Ambulatory Visit: Payer: Self-pay

## 2021-05-12 ENCOUNTER — Ambulatory Visit (INDEPENDENT_AMBULATORY_CARE_PROVIDER_SITE_OTHER): Payer: Medicare PPO

## 2021-05-12 DIAGNOSIS — Z1231 Encounter for screening mammogram for malignant neoplasm of breast: Secondary | ICD-10-CM

## 2021-05-13 NOTE — Progress Notes (Signed)
Normal mammogram. Follow up in 1 year.

## 2021-06-29 DIAGNOSIS — H3582 Retinal ischemia: Secondary | ICD-10-CM | POA: Diagnosis not present

## 2021-06-29 DIAGNOSIS — H35033 Hypertensive retinopathy, bilateral: Secondary | ICD-10-CM | POA: Diagnosis not present

## 2021-06-29 DIAGNOSIS — H34811 Central retinal vein occlusion, right eye, with macular edema: Secondary | ICD-10-CM | POA: Diagnosis not present

## 2021-06-29 DIAGNOSIS — H43392 Other vitreous opacities, left eye: Secondary | ICD-10-CM | POA: Diagnosis not present

## 2021-06-29 DIAGNOSIS — H43813 Vitreous degeneration, bilateral: Secondary | ICD-10-CM | POA: Diagnosis not present

## 2021-06-30 ENCOUNTER — Encounter: Payer: Self-pay | Admitting: Physician Assistant

## 2021-06-30 DIAGNOSIS — H34811 Central retinal vein occlusion, right eye, with macular edema: Secondary | ICD-10-CM | POA: Insufficient documentation

## 2021-07-16 ENCOUNTER — Telehealth: Payer: Self-pay | Admitting: Physician Assistant

## 2021-07-16 DIAGNOSIS — Z0000 Encounter for general adult medical examination without abnormal findings: Secondary | ICD-10-CM

## 2021-07-16 DIAGNOSIS — E7800 Pure hypercholesterolemia, unspecified: Secondary | ICD-10-CM

## 2021-07-16 DIAGNOSIS — Z1329 Encounter for screening for other suspected endocrine disorder: Secondary | ICD-10-CM

## 2021-07-16 DIAGNOSIS — Z131 Encounter for screening for diabetes mellitus: Secondary | ICD-10-CM

## 2021-07-16 NOTE — Telephone Encounter (Signed)
Patient scheduled for annual physical 5/18 & would like to come for lab work before appt to be able to discuss results at appt.  Please advise ?

## 2021-07-16 NOTE — Telephone Encounter (Signed)
Labs ordered, patient made aware.  ?

## 2021-07-19 DIAGNOSIS — Z131 Encounter for screening for diabetes mellitus: Secondary | ICD-10-CM | POA: Diagnosis not present

## 2021-07-19 DIAGNOSIS — E7800 Pure hypercholesterolemia, unspecified: Secondary | ICD-10-CM | POA: Diagnosis not present

## 2021-07-19 DIAGNOSIS — Z1329 Encounter for screening for other suspected endocrine disorder: Secondary | ICD-10-CM | POA: Diagnosis not present

## 2021-07-19 DIAGNOSIS — Z0000 Encounter for general adult medical examination without abnormal findings: Secondary | ICD-10-CM | POA: Diagnosis not present

## 2021-07-20 ENCOUNTER — Encounter: Payer: Medicare PPO | Admitting: Physician Assistant

## 2021-07-20 LAB — COMPLETE METABOLIC PANEL WITH GFR
AG Ratio: 1.8 (calc) (ref 1.0–2.5)
ALT: 21 U/L (ref 6–29)
AST: 19 U/L (ref 10–35)
Albumin: 4.6 g/dL (ref 3.6–5.1)
Alkaline phosphatase (APISO): 73 U/L (ref 37–153)
BUN: 20 mg/dL (ref 7–25)
CO2: 29 mmol/L (ref 20–32)
Calcium: 9.9 mg/dL (ref 8.6–10.4)
Chloride: 103 mmol/L (ref 98–110)
Creat: 0.83 mg/dL (ref 0.50–1.05)
Globulin: 2.5 g/dL (calc) (ref 1.9–3.7)
Glucose, Bld: 91 mg/dL (ref 65–99)
Potassium: 4.1 mmol/L (ref 3.5–5.3)
Sodium: 142 mmol/L (ref 135–146)
Total Bilirubin: 0.5 mg/dL (ref 0.2–1.2)
Total Protein: 7.1 g/dL (ref 6.1–8.1)
eGFR: 77 mL/min/{1.73_m2} (ref >=60)

## 2021-07-20 LAB — CBC WITH DIFFERENTIAL/PLATELET
Absolute Monocytes: 291 cells/uL (ref 200–950)
Basophils Absolute: 41 cells/uL (ref 0–200)
Basophils Relative: 0.8 %
Eosinophils Absolute: 112 cells/uL (ref 15–500)
Eosinophils Relative: 2.2 %
HCT: 41.2 % (ref 35.0–45.0)
Hemoglobin: 13.6 g/dL (ref 11.7–15.5)
Lymphs Abs: 1897 cells/uL (ref 850–3900)
MCH: 29.1 pg (ref 27.0–33.0)
MCHC: 33 g/dL (ref 32.0–36.0)
MCV: 88.2 fL (ref 80.0–100.0)
MPV: 10.5 fL (ref 7.5–12.5)
Monocytes Relative: 5.7 %
Neutro Abs: 2759 cells/uL (ref 1500–7800)
Neutrophils Relative %: 54.1 %
Platelets: 224 10*3/uL (ref 140–400)
RBC: 4.67 10*6/uL (ref 3.80–5.10)
RDW: 12.6 % (ref 11.0–15.0)
Total Lymphocyte: 37.2 %
WBC: 5.1 10*3/uL (ref 3.8–10.8)

## 2021-07-20 LAB — LIPID PANEL W/REFLEX DIRECT LDL
Cholesterol: 231 mg/dL — ABNORMAL HIGH (ref <200)
HDL: 89 mg/dL (ref >=50)
LDL Cholesterol (Calc): 117 mg/dL (calc) — ABNORMAL HIGH
Non-HDL Cholesterol (Calc): 142 mg/dL (calc) — ABNORMAL HIGH (ref <130)
Total CHOL/HDL Ratio: 2.6 (calc) (ref <5.0)
Triglycerides: 130 mg/dL (ref <150)

## 2021-07-20 LAB — TSH: TSH: 1.09 mIU/L (ref 0.40–4.50)

## 2021-07-20 NOTE — Progress Notes (Signed)
Thyroid looks great.  ?Kidney, glucose and liver look wonderful.  ?Hemoglobin and WBC look great.  ? ?[name].The 10-year ASCVD risk score (Arnett DK, et al., 2019) is: 7.1% ?  Values used to calculate the score: ?    Age: 66 years ?    Sex: Female ?    Is Non-Hispanic African American: No ?    Diabetic: No ?    Tobacco smoker: No ?    Systolic Blood Pressure: 451 mmHg ?    Is BP treated: No ?    HDL Cholesterol: 89 mg/dL ?    Total Cholesterol: 231 mg/dL ? ?10 year CV risk is 7.1 percent. Are you still taking crestor every other day?  ?HDL, good cholesterol, looks great.  ?LDL, bad cholesterol, not quite optimal.  ?We can talk about increasing crestor to get you to optimal goal at [facility].  ?

## 2021-07-22 ENCOUNTER — Encounter: Payer: Self-pay | Admitting: Physician Assistant

## 2021-07-22 ENCOUNTER — Ambulatory Visit (INDEPENDENT_AMBULATORY_CARE_PROVIDER_SITE_OTHER): Payer: Medicare PPO | Admitting: Physician Assistant

## 2021-07-22 VITALS — BP 132/80 | HR 78 | Ht 64.0 in | Wt 137.0 lb

## 2021-07-22 DIAGNOSIS — H34811 Central retinal vein occlusion, right eye, with macular edema: Secondary | ICD-10-CM | POA: Diagnosis not present

## 2021-07-22 DIAGNOSIS — E7800 Pure hypercholesterolemia, unspecified: Secondary | ICD-10-CM | POA: Diagnosis not present

## 2021-07-22 DIAGNOSIS — Z0000 Encounter for general adult medical examination without abnormal findings: Secondary | ICD-10-CM

## 2021-07-22 MED ORDER — ROSUVASTATIN CALCIUM 5 MG PO TABS: ORAL_TABLET | ORAL | 1 refills | Status: DC

## 2021-07-22 NOTE — Patient Instructions (Addendum)
Start ASA '81mg'$  daily Will order calcium score  Health Maintenance After Age 67 After age 71, you are at a higher risk for certain long-term diseases and infections as well as injuries from falls. Falls are a major cause of broken bones and head injuries in people who are older than age 67. Getting regular preventive care can help to keep you healthy and well. Preventive care includes getting regular testing and making lifestyle changes as recommended by your health care provider. Talk with your health care provider about: Which screenings and tests you should have. A screening is a test that checks for a disease when you have no symptoms. A diet and exercise plan that is right for you. What should I know about screenings and tests to prevent falls? Screening and testing are the best ways to find a health problem early. Early diagnosis and treatment give you the best chance of managing medical conditions that are common after age 67. Certain conditions and lifestyle choices may make you more likely to have a fall. Your health care provider may recommend: Regular vision checks. Poor vision and conditions such as cataracts can make you more likely to have a fall. If you wear glasses, make sure to get your prescription updated if your vision changes. Medicine review. Work with your health care provider to regularly review all of the medicines you are taking, including over-the-counter medicines. Ask your health care provider about any side effects that may make you more likely to have a fall. Tell your health care provider if any medicines that you take make you feel dizzy or sleepy. Strength and balance checks. Your health care provider may recommend certain tests to check your strength and balance while standing, walking, or changing positions. Foot health exam. Foot pain and numbness, as well as not wearing proper footwear, can make you more likely to have a fall. Screenings, including: Osteoporosis  screening. Osteoporosis is a condition that causes the bones to get weaker and break more easily. Blood pressure screening. Blood pressure changes and medicines to control blood pressure can make you feel dizzy. Depression screening. You may be more likely to have a fall if you have a fear of falling, feel depressed, or feel unable to do activities that you used to do. Alcohol use screening. Using too much alcohol can affect your balance and may make you more likely to have a fall. Follow these instructions at home: Lifestyle Do not drink alcohol if: Your health care provider tells you not to drink. If you drink alcohol: Limit how much you have to: 0-1 drink a day for women. 0-2 drinks a day for men. Know how much alcohol is in your drink. In the [place] one drink equals one 12 oz bottle of beer (355 mL), one 5 oz glass of wine (148 mL), or one 1 oz glass of hard liquor (44 mL). Do not use any products that contain nicotine or tobacco. These products include cigarettes, chewing tobacco, and vaping devices, such as e-cigarettes. If you need help quitting, ask your health care provider. Activity  Follow a regular exercise program to stay fit. This will help you maintain your balance. Ask your health care provider what types of exercise are appropriate for you. If you need a cane or walker, use it as recommended by your health care provider. Wear supportive shoes that have nonskid soles. Safety  Remove any tripping hazards, such as rugs, cords, and clutter. Install safety equipment such as grab bars in bathrooms  and safety rails on stairs. Keep rooms and walkways well-lit. General instructions Talk with your health care provider about your risks for falling. Tell your health care provider if: You fall. Be sure to tell your health care provider about all falls, even ones that seem minor. You feel dizzy, tiredness (fatigue), or off-balance. Take over-the-counter and prescription medicines only  as told by your health care provider. These include supplements. Eat a healthy diet and maintain a healthy weight. A healthy diet includes low-fat dairy products, low-fat (lean) meats, and fiber from whole grains, beans, and lots of fruits and vegetables. Stay current with your vaccines. Schedule regular health, dental, and eye exams. Summary Having a healthy lifestyle and getting preventive care can help to protect your health and wellness after age 67. Screening and testing are the best way to find a health problem early and help you avoid having a fall. Early diagnosis and treatment give you the best chance for managing medical conditions that are more common for people who are older than age 67. Falls are a major cause of broken bones and head injuries in people who are older than age 67. Take precautions to prevent a fall at home. Work with your health care provider to learn what changes you can make to improve your health and wellness and to prevent falls. This information is not intended to replace advice given to you by your health care provider. Make sure you discuss any questions you have with your health care provider. Document Revised: 07/13/2020 Document Reviewed: 07/13/2020 Elsevier Patient Education  [place].

## 2021-07-22 NOTE — Progress Notes (Addendum)
Complete physical exam  Patient: Katie Walker   DOB: October 07, 1955   66 y.o. Female  MRN: [id number]  Subjective:    Chief Complaint  Patient presents with   Annual Exam    Katie Walker is a 66 y.o. female who presents today for a complete physical exam. She reports consuming a general diet.  She walks regularly and very active with her grandchildren.  She generally feels well. She reports sleeping well. She does not have additional problems to discuss today.    Most recent fall risk assessment:    09/29/2020    9:00 AM  [place] in the past year? 0  Number falls in past yr: 0  Injury with Fall? 0  Risk for fall due to : No Fall Risks  Follow up Falls evaluation completed     Most recent depression screenings:    09/29/2020    9:00 AM 05/15/2020    1:29 PM  PHQ 2/9 Scores  PHQ - 2 Score 0 0  PHQ- 9 Score 0     Vision:Within last year and Dental: No current dental problems and Receives regular dental care  Patient Active Problem List   Diagnosis Date Noted   Central retinal vein occlusion with macular edema of right eye 06/30/2021   Uterine polyp 04/13/2020   Bloating 04/13/2020   Osteopenia 08/21/2019   Dysphagia 12/04/2017   Cataracts, bilateral 08/18/2017   Posterior vitreous detachment, bilateral 08/18/2017   Dermatochalasis of both upper eyelids 08/18/2017   Disorder of refraction 08/18/2017   Elevated LDL cholesterol level 12/01/2016   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 8, 2022   Breast disease    Endometrial polyp    GERD (gastroesophageal reflux disease)    Uterine fibroid    Past Surgical History:  Procedure Laterality Date   BREAST BIOPSY Right    BREAST EXCISIONAL BIOPSY Right 1993   BREAST EXCISIONAL BIOPSY Left 1998   BREAST  EXCISIONAL BIOPSY Right 2005   COLONOSCOPY     HYSTEROSCOPY     Family History  Problem Relation Age of Onset   Colon cancer Father    Cancer Father    Thyroid cancer Sister    Breast cancer Paternal Grandmother    Cancer Paternal Grandmother    Colon cancer Paternal Grandfather        bone   Cancer Paternal Grandfather    Cancer Maternal Grandmother        bowel   Heart disease Maternal Grandfather    Cancer Sister    No Known Allergies    Patient Care Team: Lavada Mesi as PCP - General (Family Medicine)   Outpatient Medications Prior to Visit  Medication Sig   b complex vitamins tablet Take 1 tablet by mouth daily.   Calcium Carbonate-Vitamin D (CALCIUM + D PO) Take by mouth.   Cyanocobalamin (HM SUPER VITAMIN B12 PO) Take by mouth.   famotidine (PEPCID) 20 MG tablet Take 20 mg by mouth 2 (two) times daily.   Multiple Vitamin (MULTI VITAMIN DAILY PO) Take by mouth.   Omega-3 Fatty Acids (OMEGA 3 PO) Take by mouth.   rosuvastatin (CRESTOR) 5 MG tablet Take one tablet every other day.   No facility-administered medications prior to visit.  Review of Systems  All other systems reviewed and are negative.        Objective:     There were no vitals taken for this visit. BP Readings from Last 3 Encounters:  12/01/20 137/77  05/15/20 133/70  04/13/20 (!) 147/70   Wt Readings from Last 3 Encounters:  12/01/20 140 lb (63.5 kg)  05/15/20 140 lb (63.5 kg)  04/13/20 139 lb (63 kg)      Physical Exam  BP 132/80   Pulse 78   Ht $R'5\' 4"'DM$  (1.626 m)   Wt 137 lb (62.1 kg)   SpO2 99%   BMI 23.52 kg/m   General Appearance:    Alert, cooperative, no distress, appears stated age  Head:    Normocephalic, without obvious abnormality, atraumatic  Eyes:    PERRL, conjunctiva/corneas clear, EOM's intact, fundi    benign, both eyes  Ears:    Normal TM's and external ear canals, both ears  Nose:   Nares normal, septum midline, mucosa normal, no drainage    or  sinus tenderness  Throat:   Lips, mucosa, and tongue normal; teeth and gums normal  Neck:   Supple, symmetrical, trachea midline, no adenopathy;    thyroid:  no enlargement/tenderness/nodules; no carotid   bruit or JVD  Back:     Symmetric, no curvature, ROM normal, no CVA tenderness  Lungs:     Clear to auscultation bilaterally, respirations unlabored  Chest Wall:    No tenderness or deformity   Heart:    Regular rate and rhythm, S1 and S2 normal, no murmur, rub   or gallop     Abdomen:     Soft, non-tender, bowel sounds active all four quadrants,    no masses, no organomegaly        Extremities:   Extremities normal, atraumatic, no cyanosis or edema  Pulses:   2+ and symmetric all extremities  Skin:   Skin color, texture, turgor normal, no rashes or lesions  Lymph nodes:   Cervical, supraclavicular, and axillary nodes normal  Neurologic:   CNII-XII intact, normal strength, sensation and reflexes    throughout    Last CBC Lab Results  Component Value Date   WBC 5.1 07/19/2021   HGB 13.6 07/19/2021   HCT 41.2 07/19/2021   MCV 88.2 07/19/2021   MCH 29.1 07/19/2021   RDW 12.6 07/19/2021   PLT 224 30/86/5784   Last metabolic panel Lab Results  Component Value Date   GLUCOSE 91 07/19/2021   NA 142 07/19/2021   K 4.1 07/19/2021   CL 103 07/19/2021   CO2 29 07/19/2021   BUN 20 07/19/2021   CREATININE 0.83 07/19/2021   EGFR 77 07/19/2021   CALCIUM 9.9 07/19/2021   PROT 7.1 07/19/2021   ALBUMIN 4.4 11/16/2015   BILITOT 0.5 07/19/2021   ALKPHOS 71 11/16/2015   AST 19 07/19/2021   ALT 21 07/19/2021   Last lipids Lab Results  Component Value Date   CHOL 231 (H) 07/19/2021   HDL 89 07/19/2021   LDLCALC 117 (H) 07/19/2021   TRIG 130 07/19/2021   CHOLHDL 2.6 07/19/2021   Last hemoglobin A1c No results found for: HGBA1C Last thyroid functions Lab Results  Component Value Date   TSH 1.09 07/19/2021         Assessment & Plan:    Routine Health Maintenance and  Physical Exam  Immunization History  Administered Date(s) Administered   Fluad Quad(high Dose 65+) 12/01/2020   Influenza,inj,Quad PF,6+ Mos 02/24/2020  PFIZER(Purple Top)SARS-COV-2 Vaccination 06/18/2019, 07/09/2019, 02/04/2020   Pneumococcal Conjugate-13 08/15/2019   Pneumococcal Polysaccharide-23 12/01/2020   Tdap 06/24/2014, 11/12/2014   Zoster Recombinat (Shingrix) 11/29/2016, 01/30/2017   Zoster, Live 06/24/2014    Health Maintenance  Topic Date Due   COVID-19 Vaccine (4 - Booster for Pfizer series) 03/31/2020   INFLUENZA VACCINE  10/05/2021   DEXA SCAN  08/21/2022   MAMMOGRAM  05/13/2023   COLONOSCOPY (Pts 45-19yrs Insurance coverage will need to be confirmed)  10/01/2023   TETANUS/TDAP  11/11/2024   Pneumonia Vaccine 14+ Years old  Completed   Hepatitis C Screening  Completed   Zoster Vaccines- Shingrix  Completed   HPV VACCINES  Aged Out    Discussed health benefits of physical activity, and encouraged her to engage in regular exercise appropriate for her age and condition.  Reviewed labs .[name] Discussed 150 minutes of exercise a week.  Encouraged vitamin D 1000 units and Calcium $RemoveBef'1300mg'BpFHePeiTW$  or 4 servings of dairy a day.  Discussed LDL pt is going to increase crestor to every other day primary prevention goal under 100. Start ASA due to central vein occulusion-Dr. Iona Hansen is managing.  Consider about her CV risk. Will get calcium CT score.  Recheck bP and looks much better goal is under 130/80 Mammogram/bone density UTD Colonoscopy UTD Shingles/pneumonia/flu/covid UTD Recheck labs in 6 months       Iran Planas, PA-C

## 2021-07-27 NOTE — Addendum Note (Signed)
Addended by: Donella Stade on: 07/27/2021 10:08 AM   Modules accepted: Orders

## 2021-07-28 LAB — HM DIABETES EYE EXAM

## 2021-07-30 DIAGNOSIS — H35033 Hypertensive retinopathy, bilateral: Secondary | ICD-10-CM | POA: Diagnosis not present

## 2021-07-30 DIAGNOSIS — H34811 Central retinal vein occlusion, right eye, with macular edema: Secondary | ICD-10-CM | POA: Diagnosis not present

## 2021-07-30 DIAGNOSIS — H43392 Other vitreous opacities, left eye: Secondary | ICD-10-CM | POA: Diagnosis not present

## 2021-07-30 DIAGNOSIS — H43813 Vitreous degeneration, bilateral: Secondary | ICD-10-CM | POA: Diagnosis not present

## 2021-07-30 DIAGNOSIS — H3582 Retinal ischemia: Secondary | ICD-10-CM | POA: Diagnosis not present

## 2021-08-03 ENCOUNTER — Telehealth: Payer: Self-pay | Admitting: Neurology

## 2021-08-03 NOTE — Telephone Encounter (Signed)
Patient called and left vm stating she got an alert that she had a DM Eye exam on 07/28/2021 and not sure what that was about as she was not here.  It looks like patient's information was abstracted into our chart that day, LVM letting patient know.

## 2021-09-08 ENCOUNTER — Ambulatory Visit (INDEPENDENT_AMBULATORY_CARE_PROVIDER_SITE_OTHER): Payer: Self-pay

## 2021-09-08 DIAGNOSIS — H34811 Central retinal vein occlusion, right eye, with macular edema: Secondary | ICD-10-CM

## 2021-09-08 DIAGNOSIS — E7800 Pure hypercholesterolemia, unspecified: Secondary | ICD-10-CM

## 2021-09-08 NOTE — Progress Notes (Signed)
Calcium score is 0 which is GREAT. Keep statin dose the same.

## 2021-09-14 ENCOUNTER — Encounter: Payer: Self-pay | Admitting: Physician Assistant

## 2021-09-14 ENCOUNTER — Other Ambulatory Visit: Payer: Self-pay

## 2021-09-14 DIAGNOSIS — H34811 Central retinal vein occlusion, right eye, with macular edema: Secondary | ICD-10-CM | POA: Diagnosis not present

## 2021-09-14 NOTE — Progress Notes (Signed)
Opened in error. T. Chiyoko Torrico, CMA 

## 2021-09-28 DIAGNOSIS — L814 Other melanin hyperpigmentation: Secondary | ICD-10-CM | POA: Diagnosis not present

## 2021-09-28 DIAGNOSIS — D1801 Hemangioma of skin and subcutaneous tissue: Secondary | ICD-10-CM | POA: Diagnosis not present

## 2021-09-28 DIAGNOSIS — L821 Other seborrheic keratosis: Secondary | ICD-10-CM | POA: Diagnosis not present

## 2021-09-28 DIAGNOSIS — Z1283 Encounter for screening for malignant neoplasm of skin: Secondary | ICD-10-CM | POA: Diagnosis not present

## 2021-10-27 ENCOUNTER — Encounter: Payer: Self-pay | Admitting: General Practice

## 2021-11-02 DIAGNOSIS — H43813 Vitreous degeneration, bilateral: Secondary | ICD-10-CM | POA: Diagnosis not present

## 2021-11-02 DIAGNOSIS — H3582 Retinal ischemia: Secondary | ICD-10-CM | POA: Diagnosis not present

## 2021-11-02 DIAGNOSIS — H34811 Central retinal vein occlusion, right eye, with macular edema: Secondary | ICD-10-CM | POA: Diagnosis not present

## 2021-11-02 DIAGNOSIS — H35033 Hypertensive retinopathy, bilateral: Secondary | ICD-10-CM | POA: Diagnosis not present

## 2021-11-02 DIAGNOSIS — H43392 Other vitreous opacities, left eye: Secondary | ICD-10-CM | POA: Diagnosis not present

## 2021-11-05 ENCOUNTER — Ambulatory Visit (INDEPENDENT_AMBULATORY_CARE_PROVIDER_SITE_OTHER): Payer: Medicare PPO | Admitting: Physician Assistant

## 2021-11-05 DIAGNOSIS — Z0000 Encounter for general adult medical examination without abnormal findings: Secondary | ICD-10-CM

## 2021-11-05 NOTE — Patient Instructions (Addendum)
[place] Maintenance Summary and Written Plan of Care  Katie Walker ,  Thank you for allowing me to perform your Medicare Annual Wellness Visit and for your ongoing commitment to your health.   Health Maintenance & Immunization History Health Maintenance  Topic Date Due  . COVID-19 Vaccine (4 - Pfizer risk series) 03/31/2020  . INFLUENZA VACCINE  10/05/2021  . DEXA SCAN  08/21/2022  . MAMMOGRAM  05/13/2023  . COLONOSCOPY (Pts 45-31yr Insurance coverage will need to be confirmed)  10/01/2023  . TETANUS/TDAP  11/11/2024  . Pneumonia Vaccine 66 Years old  Completed  . Hepatitis C Screening  Completed  . Zoster Vaccines- Shingrix  Completed  . HPV VACCINES  Aged Out   Immunization History  Administered Date(s) Administered  . Fluad Quad(high Dose 65+) 12/01/2020  . Influenza,inj,Quad PF,6+ Mos 02/24/2020  . PFIZER(Purple Top)SARS-COV-2 Vaccination 06/18/2019, 07/09/2019, 02/04/2020  . Pneumococcal Conjugate-13 08/15/2019  . Pneumococcal Polysaccharide-23 12/01/2020  . Tdap 06/24/2014, 11/12/2014  . Zoster Recombinat (Shingrix) 11/29/2016, 01/30/2017  . Zoster, Live 06/24/2014    These are the patient goals that we discussed:  Goals Addressed              This Visit's Progress   .  Patient Stated (pt-stated)        11/05/2021 AWV Goal: Exercise for General Health  Patient will verbalize understanding of the benefits of increased physical activity: Exercising regularly is important. It will improve your overall fitness, flexibility, and endurance. Regular exercise also will improve your overall health. It can help you control your weight, reduce stress, and improve your bone density. Over the next year, patient will increase physical activity as tolerated with a goal of at least 150 minutes of moderate physical activity per week.  You can tell that you are exercising at a moderate intensity if your heart starts beating faster and you start  breathing faster but can still hold a conversation. Moderate-intensity exercise ideas include: Walking 1 mile (1.6 km) in about 15 minutes Biking Hiking Golfing Dancing Water aerobics Patient will verbalize understanding of everyday activities that increase physical activity by providing examples like the following: Yard work, such as: PSales promotion account executiveGardening Washing windows or floors Patient will be able to explain general safety guidelines for exercising:  Before you start a new exercise program, talk with your health care provider. Do not exercise so much that you hurt yourself, feel dizzy, or get very short of breath. Wear comfortable clothes and wear shoes with good support. Drink plenty of water while you exercise to prevent dehydration or heat stroke. Work out until your breathing and your heartbeat get faster.         This is a list of Health Maintenance Items that are overdue or due now: Health Maintenance Due  Topic Date Due  . COVID-19 Vaccine (4 - Pfizer risk series) 03/31/2020  . INFLUENZA VACCINE  10/05/2021     Orders/Referrals Placed Today: No orders of the defined types were placed in this encounter.  (Contact our referral department at [phone] you have not spoken with someone about your referral appointment within the next 5 days)    Follow-up Plan       Health Maintenance, Female Adopting a healthy lifestyle and getting preventive care are important in promoting health and wellness. Ask your health care provider about: The right schedule for you to have regular tests  and exams. Things you can do on your own to prevent diseases and keep yourself healthy. What should I know about diet, weight, and exercise? Eat a healthy diet  Eat a diet that includes plenty of vegetables, fruits, low-fat dairy products, and lean protein. Do not eat a lot of foods that are  high in solid fats, added sugars, or sodium. Maintain a healthy weight Body mass index (BMI) is used to identify weight problems. It estimates body fat based on height and weight. Your health care provider can help determine your BMI and help you achieve or maintain a healthy weight. Get regular exercise Get regular exercise. This is one of the most important things you can do for your health. Most adults should: Exercise for at least 150 minutes each week. The exercise should increase your heart rate and make you sweat (moderate-intensity exercise). Do strengthening exercises at least twice a week. This is in addition to the moderate-intensity exercise. Spend less time sitting. Even light physical activity can be beneficial. Watch cholesterol and blood lipids Have your blood tested for lipids and cholesterol at 67 years of age, then have this test every 5 years. Have your cholesterol levels checked more often if: Your lipid or cholesterol levels are high. You are older than 67 years of age. You are at high risk for heart disease. What should I know about cancer screening? Depending on your health history and family history, you may need to have cancer screening at various ages. This may include screening for: Breast cancer. Cervical cancer. Colorectal cancer. Skin cancer. Lung cancer. What should I know about heart disease, diabetes, and high blood pressure? Blood pressure and heart disease High blood pressure causes heart disease and increases the risk of stroke. This is more likely to develop in people who have high blood pressure readings or are overweight. Have your blood pressure checked: Every 3-5 years if you are 41-14 years of age. Every year if you are 12 years old or older. Diabetes Have regular diabetes screenings. This checks your fasting blood sugar level. Have the screening done: Once every three years after age 21 if you are at a normal weight and have a low risk for  diabetes. More often and at a younger age if you are overweight or have a high risk for diabetes. What should I know about preventing infection? Hepatitis B If you have a higher risk for hepatitis B, you should be screened for this virus. Talk with your health care provider to find out if you are at risk for hepatitis B infection. Hepatitis C Testing is recommended for: Everyone born from [birth year] through 1966. Anyone with known risk factors for hepatitis C. Sexually transmitted infections (STIs) Get screened for STIs, including gonorrhea and chlamydia, if: You are sexually active and are younger than 67 years of age. You are older than 67 years of age and your health care provider tells you that you are at risk for this type of infection. Your sexual activity has changed since you were last screened, and you are at increased risk for chlamydia or gonorrhea. Ask your health care provider if you are at risk. Ask your health care provider about whether you are at high risk for HIV. Your health care provider may recommend a prescription medicine to help prevent HIV infection. If you choose to take medicine to prevent HIV, you should first get tested for HIV. You should then be tested every 3 months for as long as you are taking  the medicine. Pregnancy If you are about to stop having your period (premenopausal) and you may become pregnant, seek counseling before you get pregnant. Take 400 to 800 micrograms (mcg) of folic acid every day if you become pregnant. Ask for birth control (contraception) if you want to prevent pregnancy. Osteoporosis and menopause Osteoporosis is a disease in which the bones lose minerals and strength with aging. This can result in bone fractures. If you are 25 years old or older, or if you are at risk for osteoporosis and fractures, ask your health care provider if you should: Be screened for bone loss. Take a calcium or vitamin D supplement to lower your risk of  fractures. Be given hormone replacement therapy (HRT) to treat symptoms of menopause. Follow these instructions at home: Alcohol use Do not drink alcohol if: Your health care provider tells you not to drink. You are pregnant, may be pregnant, or are planning to become pregnant. If you drink alcohol: Limit how much you have to: 0-1 drink a day. Know how much alcohol is in your drink. In the [place] one drink equals one 12 oz bottle of beer (355 mL), one 5 oz glass of wine (148 mL), or one 1 oz glass of hard liquor (44 mL). Lifestyle Do not use any products that contain nicotine or tobacco. These products include cigarettes, chewing tobacco, and vaping devices, such as e-cigarettes. If you need help quitting, ask your health care provider. Do not use street drugs. Do not share needles. Ask your health care provider for help if you need support or information about quitting drugs. General instructions Schedule regular health, dental, and eye exams. Stay current with your vaccines. Tell your health care provider if: You often feel depressed. You have ever been abused or do not feel safe at home. Summary Adopting a healthy lifestyle and getting preventive care are important in promoting health and wellness. Follow your health care provider's instructions about healthy diet, exercising, and getting tested or screened for diseases. Follow your health care provider's instructions on monitoring your cholesterol and blood pressure. This information is not intended to replace advice given to you by your health care provider. Make sure you discuss any questions you have with your health care provider. Document Revised: 07/13/2020 Document Reviewed: 07/13/2020 Elsevier Patient Education  [place].

## 2021-11-05 NOTE — Progress Notes (Signed)
MEDICARE ANNUAL WELLNESS VISIT  11/05/2021  Telephone Visit Disclaimer This Medicare AWV was conducted by telephone due to national recommendations for restrictions regarding the COVID-19 Pandemic (e.g. social distancing).  I verified, using two identifiers, that I am speaking with Katie Walker or their authorized healthcare agent. I discussed the limitations, risks, security, and privacy concerns of performing an evaluation and management service by telephone and the potential availability of an in-person appointment in the future. The patient expressed understanding and agreed to proceed.  Location of Patient: Home Location of Provider (nurse):  In the office.  Subjective:    Katie Walker is a 66 y.o. female patient of Alden Hipp, Royetta Car, PA-C who had a [employer] Visit today via telephone. Katie Walker is Retired and lives with their spouse. she has 2 children. she reports that she is socially active and does interact with friends/family regularly. she is moderately physically active and enjoys reading, spending time with grandchildren, traveling and walking.  Patient Care Team: Lavada Mesi as PCP - General (Family Medicine)     11/05/2021   10:17 AM 09/29/2020    8:58 AM 06/24/2014    3:41 PM  Advanced Directives  Does Patient Have a Medical Advance Directive? Yes Yes No  Type of Advance Directive Living will Living will;Healthcare Power of Attorney   Does patient want to make changes to medical advance directive? No - Patient declined No - Patient declined   Copy of [place] in Chart?  No - copy requested   Would patient like information on creating a medical advance directive?   Yes - Educational materials given    Hospital Utilization Over the Past 12 Months: # of hospitalizations or ER visits: 0 # of surgeries: 0  Review of Systems    Patient reports that her overall health is unchanged compared to last year.  History obtained  from chart review and the patient  Patient Reported Readings (BP, Pulse, CBG, Weight, etc) none  Pain Assessment Pain : No/denies pain     Current Medications & Allergies (verified) Allergies as of 11/05/2021   No Known Allergies      Medication List        Accurate as of November 05, 2021 10:28 AM. If you have any questions, ask your nurse or doctor.          Aflibercept 2 MG/0.05ML Soln by Intravitreal route.   b complex vitamins tablet Take 1 tablet by mouth daily.   CALCIUM + D PO Take by mouth.   famotidine 20 MG tablet Commonly known as: PEPCID Take 20 mg by mouth 2 (two) times daily.   HM SUPER VITAMIN B12 PO Take by mouth.   MULTI VITAMIN DAILY PO Take by mouth.   OMEGA 3 PO Take by mouth.   rosuvastatin 5 MG tablet Commonly known as: Crestor Take one tablet every other day.        History (reviewed): Past Medical History:  Diagnosis Date   Arthritis Feb. 8, 2022   Breast disease    Endometrial polyp    GERD (gastroesophageal reflux disease)    Uterine fibroid    Past Surgical History:  Procedure Laterality Date   BREAST BIOPSY Right    BREAST EXCISIONAL BIOPSY Right 1993   BREAST EXCISIONAL BIOPSY Left 1998   BREAST EXCISIONAL BIOPSY Right 2005   COLONOSCOPY     HYSTEROSCOPY     Family History  Problem Relation Age of Onset  Colon cancer Father    Cancer Father    Thyroid cancer Sister    Breast cancer Paternal Grandmother    Cancer Paternal Grandmother    Colon cancer Paternal Grandfather        bone   Cancer Paternal Grandfather    Cancer Maternal Grandmother        bowel   Heart disease Maternal Grandfather    Cancer Sister    Social History   Socioeconomic History   Marital status: Married    Spouse name: Carloyn Manner "Buzz" Uhlig   Number of children: 2   Years of education: 16   Highest education level: Bachelor's degree (e.g., BA, AB, BS)  Occupational History   Occupation: homemaker   Occupation: retired   Tobacco Use   Smoking status: Never   Smokeless tobacco: Never  Substance and Sexual Activity   Alcohol use: Not Currently    Comment: 1 glass of wine twice a month   Drug use: No   Sexual activity: Yes    Partners: Male    Birth control/protection: Post-menopausal, None    Comment: Partner: Male  Other Topics Concern   Not on file  Social History Narrative   Lives with her husband. Enjoys reading, spending time with grandchildren, traveling and walking.   Goes to the [facility], twice a week and walks a few times a week.    Social Determinants of Health   Financial Resource Strain: Low Risk  (11/01/2021)   Overall Financial Resource Strain (CARDIA)    Difficulty of Paying Living Expenses: Not hard at all  Food Insecurity: No Food Insecurity (11/01/2021)   Hunger Vital Sign    Worried About Running Out of Food in the Last Year: Never true    Ran Out of Food in the Last Year: Never true  Transportation Needs: No Transportation Needs (11/01/2021)   PRAPARE - Hydrologist (Medical): No    Lack of Transportation (Non-Medical): No  Physical Activity: Sufficiently Active (11/01/2021)   Exercise Vital Sign    Days of Exercise per Week: 3 days    Minutes of Exercise per Session: 60 min  Stress: No Stress Concern Present (11/01/2021)   [place]    Feeling of Stress : Not at all  Social Connections: [place] (11/05/2021)   Social Connection and Isolation Panel [NHANES]    Frequency of Communication with Friends and Family: More than three times a week    Frequency of Social Gatherings with Friends and Family: Three times a week    Attends Religious Services: More than 4 times per year    Active Member of Clubs or Organizations: Yes    Attends Archivist Meetings: More than 4 times per year    Marital Status: Married    Activities of Daily Living    11/01/2021    3:34 PM   In your present state of health, do you have any difficulty performing the following activities:  Hearing? 0  Vision? 0  Difficulty concentrating or making decisions? 0  Walking or climbing stairs? 0  Dressing or bathing? 0  Doing errands, shopping? 0  Preparing Food and eating ? N  Using the Toilet? N  In the past six months, have you accidently leaked urine? N  Do you have problems with loss of bowel control? N  Managing your Medications? N  Managing your Finances? N  Housekeeping or managing your Housekeeping? N  Patient Education/ Literacy How often do you need to have someone help you when you read instructions, pamphlets, or other written materials from your doctor or pharmacy?: 1 - Never What is the last grade level you completed in school?: Bachelor's degree  Exercise Current Exercise Habits: Home exercise routine, Type of exercise: walking, Time (Minutes): 60, Frequency (Times/Week): 3, Weekly Exercise (Minutes/Week): 180, Intensity: Moderate, Exercise limited by: None identified  Diet Patient reports consuming 3 meals a day and 0 snack(s) a day Patient reports that her primary diet is: Regular Patient reports that she does have regular access to food.   Depression Screen    11/05/2021   10:18 AM 07/22/2021    9:17 AM 09/29/2020    9:00 AM 05/15/2020    1:29 PM 05/10/2019    3:59 PM 11/30/2017    8:47 AM 11/29/2016    8:22 AM  PHQ 2/9 Scores  PHQ - 2 Score 0 0 0 0 0 0 0  PHQ- 9 Score   0  2 1      Fall Risk    11/05/2021   10:18 AM 11/01/2021    3:34 PM 09/29/2020    9:00 AM 05/15/2020    1:29 PM 05/10/2019    3:59 PM  [place] in the past year? 0 0 0 0 0  Number falls in past yr: 0 0 0 0   Injury with Fall? 0 0 0 0   Risk for fall due to : No Fall Risks  No Fall Risks    Follow up Falls evaluation completed  Falls evaluation completed Falls evaluation completed      Objective:  Tarae Wooden seemed alert and oriented and she participated  appropriately during our telephone visit.  Blood Pressure Weight BMI  BP Readings from Last 3 Encounters:  07/22/21 132/80  12/01/20 137/77  05/15/20 133/70   Wt Readings from Last 3 Encounters:  07/22/21 137 lb (62.1 kg)  12/01/20 140 lb (63.5 kg)  05/15/20 140 lb (63.5 kg)   BMI Readings from Last 1 Encounters:  07/22/21 23.52 kg/m    *Unable to obtain current vital signs, weight, and BMI due to telephone visit type  Hearing/Vision  Kissy did not seem to have difficulty with hearing/understanding during the telephone conversation Reports that she has had a formal eye exam by an eye care professional within the past year Reports that she has not had a formal hearing evaluation within the past year *Unable to fully assess hearing and vision during telephone visit type  Cognitive Function:    09/29/2020    9:11 AM  6CIT Screen  What Year? 0 points  What month? 0 points  What time? 0 points  Count back from 20 0 points  Months in reverse 0 points  Repeat phrase 0 points  Total Score 0 points   (Normal:0-7, Significant for Dysfunction: >8)  Normal Cognitive Function Screening: Yes   Immunization & Health Maintenance Record Immunization History  Administered Date(s) Administered   Fluad Quad(high Dose 65+) 12/01/2020   Influenza,inj,Quad PF,6+ Mos 02/24/2020   PFIZER(Purple Top)SARS-COV-2 Vaccination 06/18/2019, 07/09/2019, 02/04/2020   Pneumococcal Conjugate-13 08/15/2019   Pneumococcal Polysaccharide-23 12/01/2020   Tdap 06/24/2014, 11/12/2014   Zoster Recombinat (Shingrix) 11/29/2016, 01/30/2017   Zoster, Live 06/24/2014    Health Maintenance  Topic Date Due   COVID-19 Vaccine (4 - Pfizer risk series) 03/31/2020   INFLUENZA VACCINE  10/05/2021   DEXA SCAN  08/21/2022   MAMMOGRAM  05/13/2023  COLONOSCOPY (Pts 45-37yr Insurance coverage will need to be confirmed)  10/01/2023   TETANUS/TDAP  11/11/2024   Pneumonia Vaccine 66 Years old  Completed    Hepatitis C Screening  Completed   Zoster Vaccines- Shingrix  Completed   HPV VACCINES  Aged Out       Assessment  This is a routine wellness examination for [employer]  Health Maintenance: Due or Overdue Health Maintenance Due  Topic Date Due   COVID-19 Vaccine (4 - Pfizer risk series) 03/31/2020   INFLUENZA VACCINE  10/05/2021    MRosemarie Beathdoes not need a referral for Community Assistance: Care Management:   no Social Work:    no Prescription Assistance:  no Nutrition/Diabetes Education:  no   Plan:  Personalized Goals  Goals Addressed               This Visit's Progress     Patient Stated (pt-stated)        11/05/2021 AWV Goal: Exercise for General Health  Patient will verbalize understanding of the benefits of increased physical activity: Exercising regularly is important. It will improve your overall fitness, flexibility, and endurance. Regular exercise also will improve your overall health. It can help you control your weight, reduce stress, and improve your bone density. Over the next year, patient will increase physical activity as tolerated with a goal of at least 150 minutes of moderate physical activity per week.  You can tell that you are exercising at a moderate intensity if your heart starts beating faster and you start breathing faster but can still hold a conversation. Moderate-intensity exercise ideas include: Walking 1 mile (1.6 km) in about 15 minutes Biking Hiking Golfing Dancing Water aerobics Patient will verbalize understanding of everyday activities that increase physical activity by providing examples like the following: Yard work, such as: PSales promotion account executiveGardening Washing windows or floors Patient will be able to explain general safety guidelines for exercising:  Before you start a new exercise program, talk with your health care  provider. Do not exercise so much that you hurt yourself, feel dizzy, or get very short of breath. Wear comfortable clothes and wear shoes with good support. Drink plenty of water while you exercise to prevent dehydration or heat stroke. Work out until your breathing and your heartbeat get faster.        Personalized Health Maintenance & Screening Recommendations  Influenza vaccine  Lung Cancer Screening Recommended: no (Low Dose CT Chest recommended if Age 67-80years, 30 pack-year currently smoking OR have quit w/in past 15 years) Hepatitis C Screening recommended: no HIV Screening recommended: no  Advanced Directives: Written information was not prepared per patient's request.  Referrals & Orders No orders of the defined types were placed in this encounter.   Follow-up Plan Follow-up with BDonella Stade PA-C as planned Medicare wellness visit in one year. Patient will access AVS on my chart.   I have personally reviewed and noted the following in the patient's chart:   Medical and social history Use of alcohol, tobacco or illicit drugs  Current medications and supplements Functional ability and status Nutritional status Physical activity Advanced directives List of other physicians Hospitalizations, surgeries, and ER visits in previous 12 months Vitals Screenings to include cognitive, depression, and falls Referrals and appointments  In addition, I have reviewed and discussed with MRosemarie Beathcertain preventive protocols, quality metrics, and best practice recommendations. A written  personalized care plan for preventive services as well as general preventive health recommendations is available and can be mailed to the patient at her request.      Tinnie Gens, RN BSN  11/05/2021

## 2022-01-04 DIAGNOSIS — H43813 Vitreous degeneration, bilateral: Secondary | ICD-10-CM | POA: Diagnosis not present

## 2022-01-04 DIAGNOSIS — H43392 Other vitreous opacities, left eye: Secondary | ICD-10-CM | POA: Diagnosis not present

## 2022-01-04 DIAGNOSIS — H3582 Retinal ischemia: Secondary | ICD-10-CM | POA: Diagnosis not present

## 2022-01-04 DIAGNOSIS — H34811 Central retinal vein occlusion, right eye, with macular edema: Secondary | ICD-10-CM | POA: Diagnosis not present

## 2022-03-29 ENCOUNTER — Other Ambulatory Visit: Payer: Self-pay | Admitting: Physician Assistant

## 2022-03-29 DIAGNOSIS — Z1231 Encounter for screening mammogram for malignant neoplasm of breast: Secondary | ICD-10-CM

## 2022-04-15 DIAGNOSIS — H35033 Hypertensive retinopathy, bilateral: Secondary | ICD-10-CM | POA: Diagnosis not present

## 2022-04-15 DIAGNOSIS — H43813 Vitreous degeneration, bilateral: Secondary | ICD-10-CM | POA: Diagnosis not present

## 2022-04-15 DIAGNOSIS — H43393 Other vitreous opacities, bilateral: Secondary | ICD-10-CM | POA: Diagnosis not present

## 2022-04-15 DIAGNOSIS — H34811 Central retinal vein occlusion, right eye, with macular edema: Secondary | ICD-10-CM | POA: Diagnosis not present

## 2022-05-10 IMAGING — MG MM DIGITAL SCREENING BILAT W/ TOMO AND CAD
8 series · 9 of 24 positions shown · non-contrast
Comparison: Previous exam(s).

CLINICAL DATA: Screening.

EXAM:
DIGITAL SCREENING BILATERAL MAMMOGRAM WITH TOMOSYNTHESIS AND CAD
TECHNIQUE: Bilateral screening digital craniocaudal and mediolateral oblique
mammograms were obtained. Bilateral screening digital breast
tomosynthesis was performed. The images were evaluated with
computer-aided detection.

[L CC synth-2D]
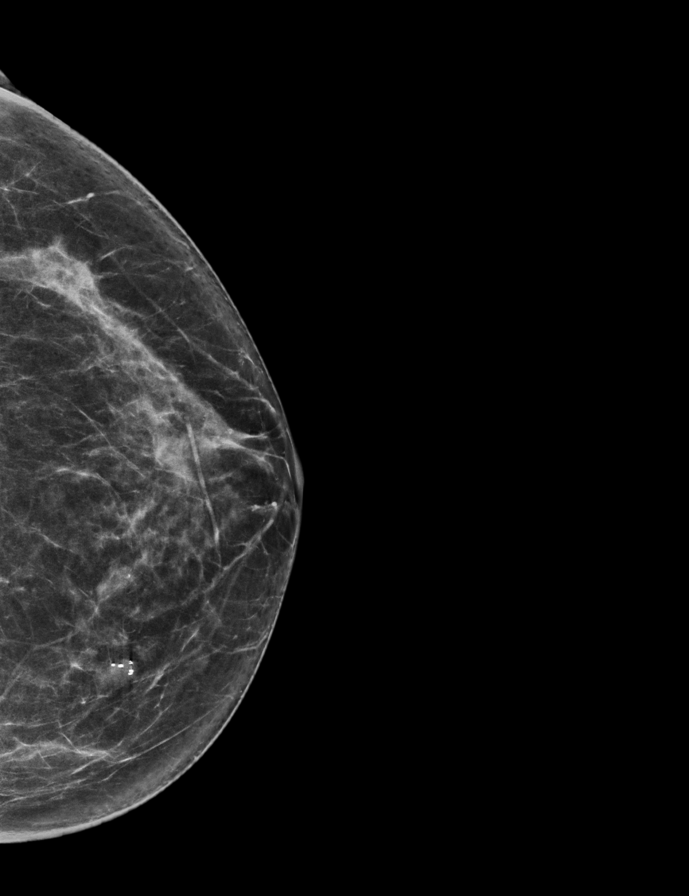

[R CC synth-2D]
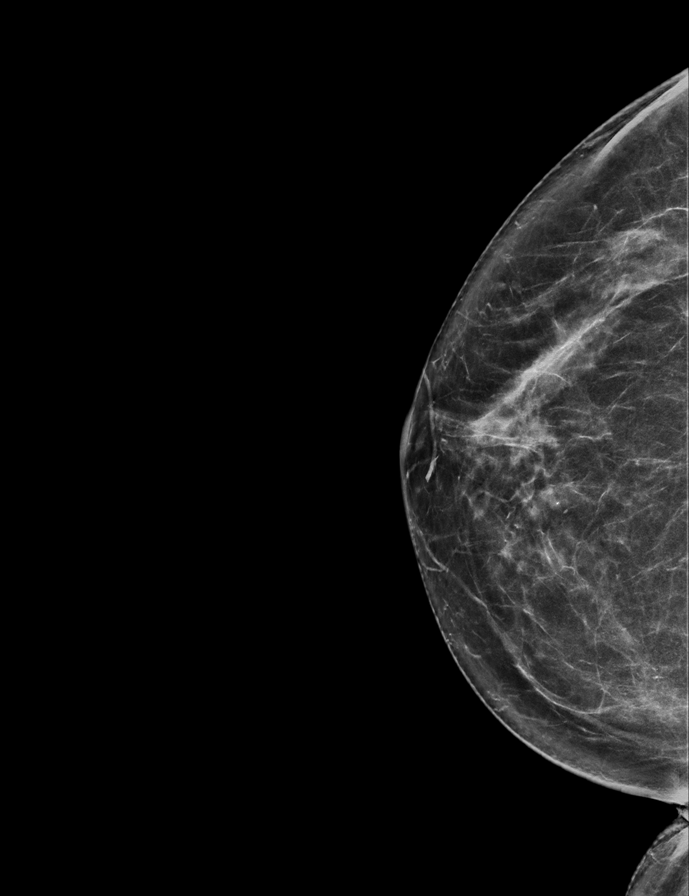

[R MLO synth-2D]
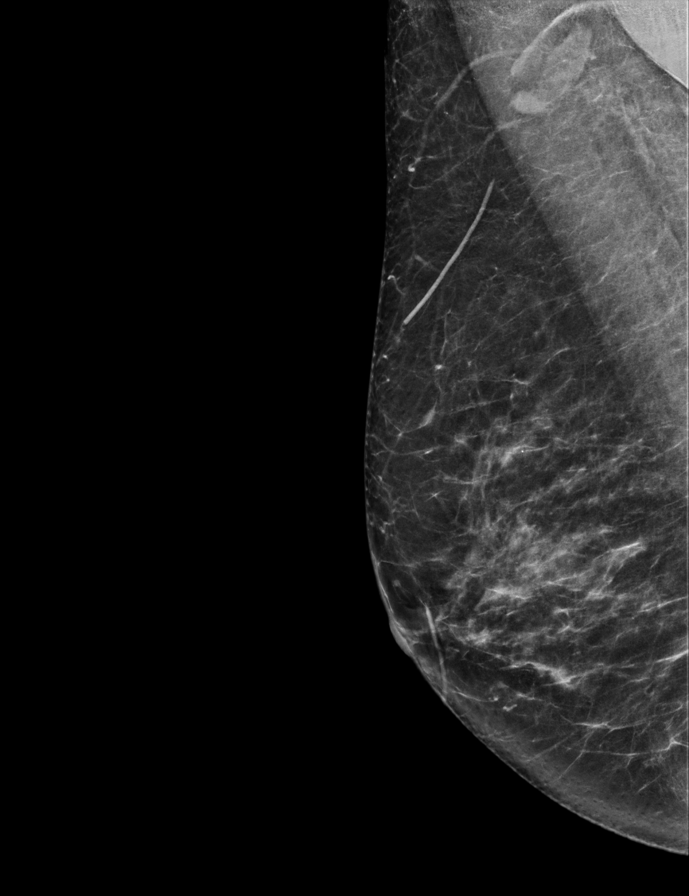

[L MLO synth-2D]
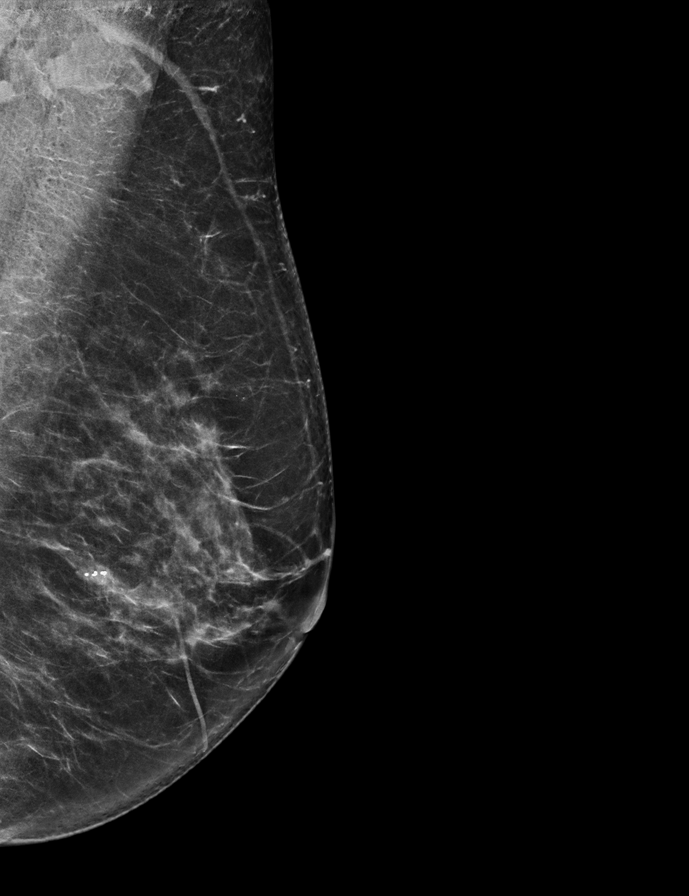

[L CC tomo · 2 of 64 frames shown]
[frame 21/64]
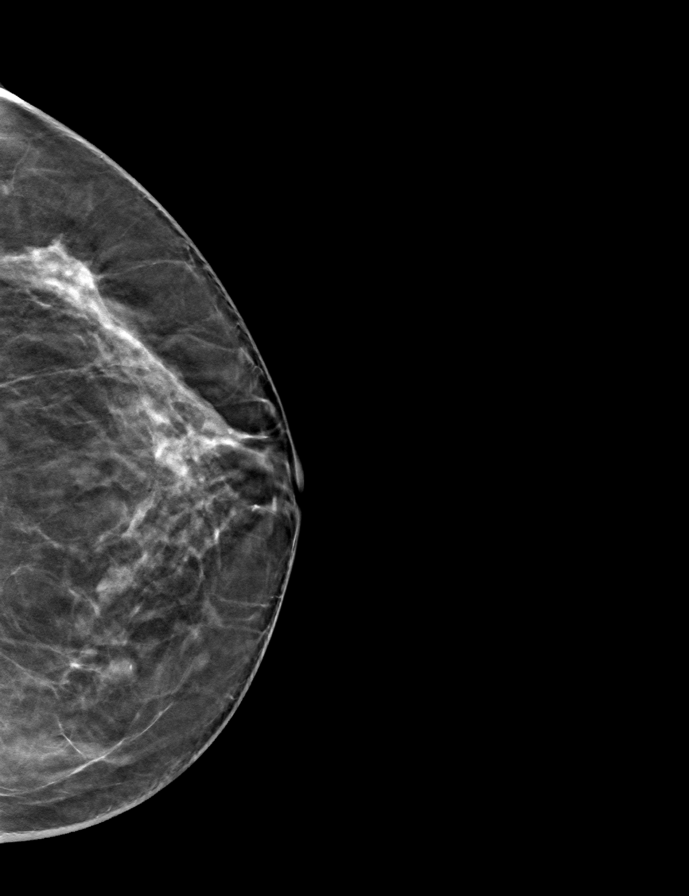
[frame 33/64]
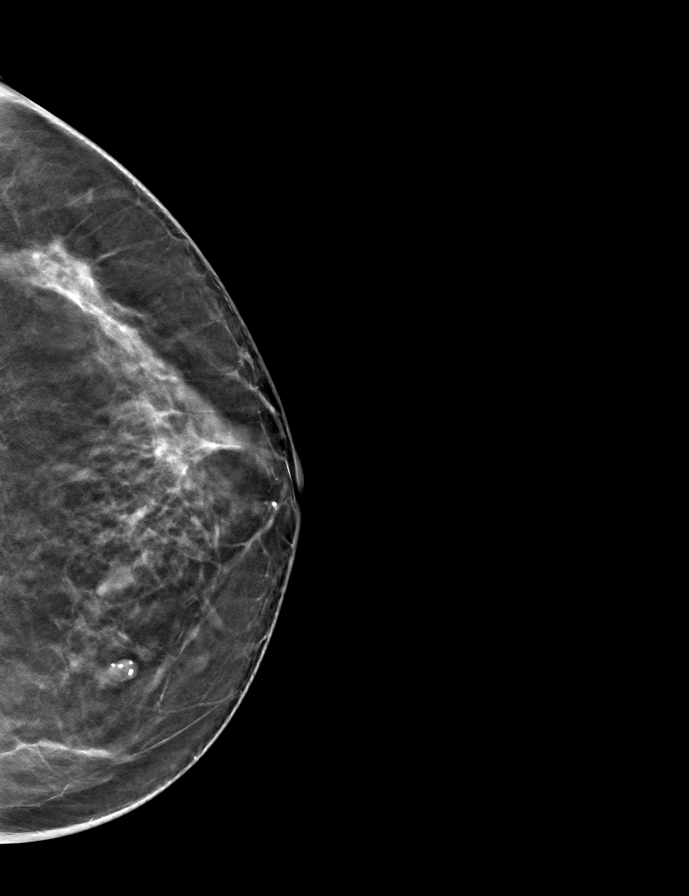

[R MLO tomo · tomo slice 32/63.0]
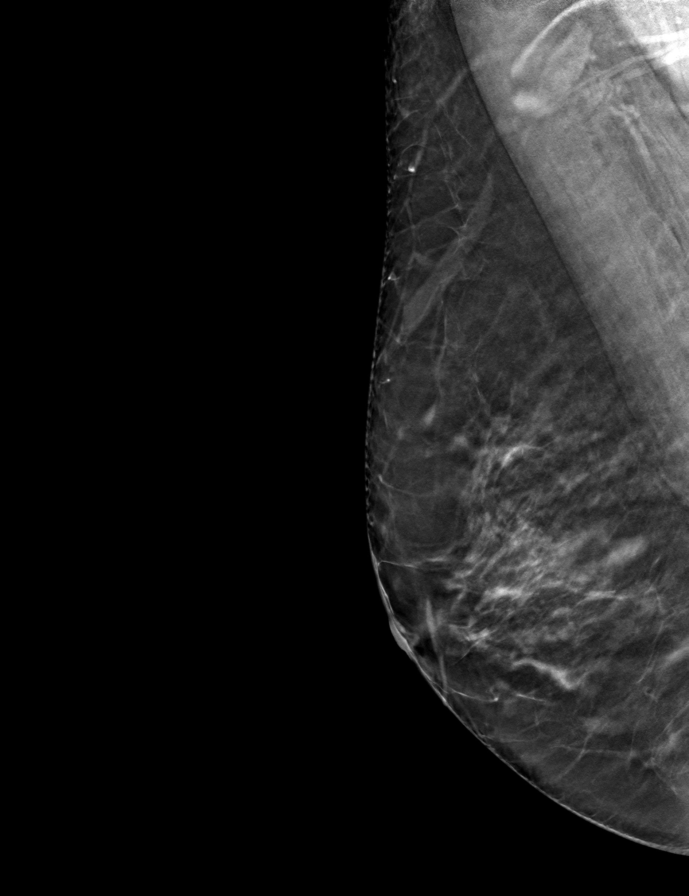

[L MLO tomo · tomo slice 33/65.0]
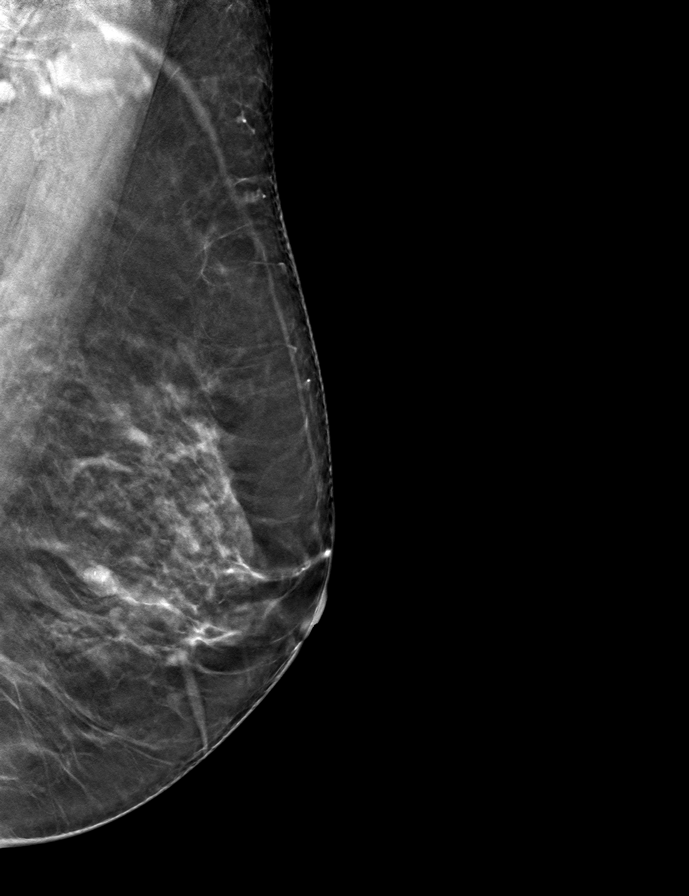

[R CC tomo · tomo slice 35/69.0]
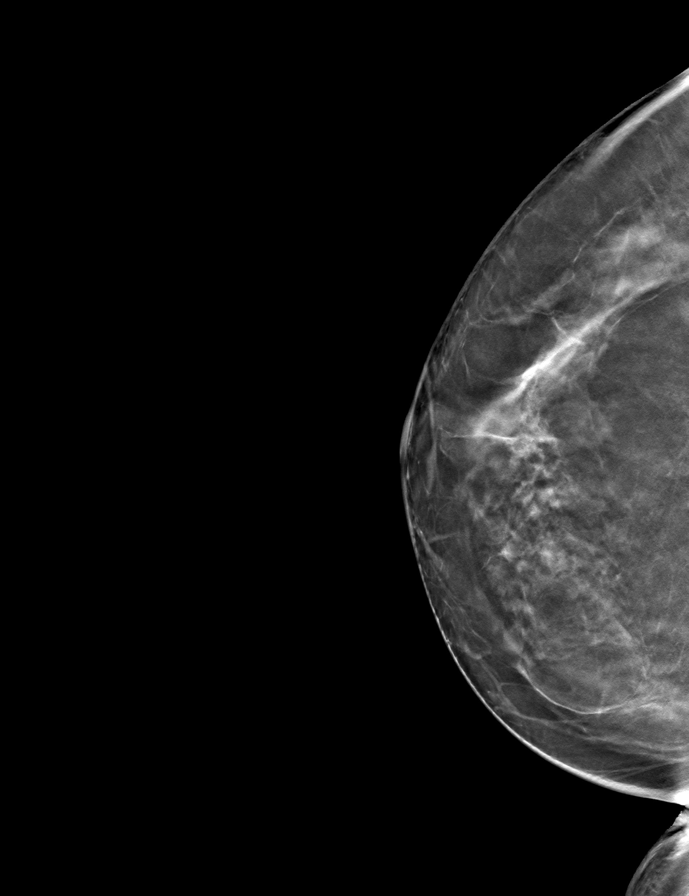

[9 of 24 positions shown; findings below may reference images not displayed]

ACR Breast Density Category b: There are scattered areas of
fibroglandular density.
FINDINGS: There are no findings suspicious for malignancy.
IMPRESSION: No mammographic evidence of malignancy. A result letter of this
screening mammogram will be mailed directly to the patient.

RECOMMENDATION:
Screening mammogram in one year. (Code:51-O-LD2)

BI-RADS CATEGORY  1: Negative.

## 2022-05-18 ENCOUNTER — Ambulatory Visit (INDEPENDENT_AMBULATORY_CARE_PROVIDER_SITE_OTHER): Payer: Medicare PPO

## 2022-05-18 DIAGNOSIS — Z1231 Encounter for screening mammogram for malignant neoplasm of breast: Secondary | ICD-10-CM

## 2022-05-19 NOTE — Progress Notes (Signed)
Normal mammogram. Follow up in one year.

## 2022-08-06 ENCOUNTER — Other Ambulatory Visit: Payer: Self-pay | Admitting: Physician Assistant

## 2022-08-06 DIAGNOSIS — E7800 Pure hypercholesterolemia, unspecified: Secondary | ICD-10-CM

## 2022-08-18 ENCOUNTER — Encounter: Payer: Self-pay | Admitting: Physician Assistant

## 2022-08-19 DIAGNOSIS — H35033 Hypertensive retinopathy, bilateral: Secondary | ICD-10-CM | POA: Diagnosis not present

## 2022-08-19 DIAGNOSIS — H34811 Central retinal vein occlusion, right eye, with macular edema: Secondary | ICD-10-CM | POA: Diagnosis not present

## 2022-08-19 DIAGNOSIS — H43813 Vitreous degeneration, bilateral: Secondary | ICD-10-CM | POA: Diagnosis not present

## 2022-08-19 DIAGNOSIS — H43393 Other vitreous opacities, bilateral: Secondary | ICD-10-CM | POA: Diagnosis not present

## 2022-08-26 ENCOUNTER — Ambulatory Visit (INDEPENDENT_AMBULATORY_CARE_PROVIDER_SITE_OTHER): Payer: Medicare PPO | Admitting: Physician Assistant

## 2022-08-26 ENCOUNTER — Encounter: Payer: Self-pay | Admitting: Physician Assistant

## 2022-08-26 VITALS — BP 124/73 | HR 70 | Ht 64.0 in | Wt 138.0 lb

## 2022-08-26 DIAGNOSIS — Z131 Encounter for screening for diabetes mellitus: Secondary | ICD-10-CM

## 2022-08-26 DIAGNOSIS — Z0000 Encounter for general adult medical examination without abnormal findings: Secondary | ICD-10-CM

## 2022-08-26 DIAGNOSIS — M8580 Other specified disorders of bone density and structure, unspecified site: Secondary | ICD-10-CM | POA: Diagnosis not present

## 2022-08-26 DIAGNOSIS — Z13220 Encounter for screening for lipoid disorders: Secondary | ICD-10-CM

## 2022-08-26 DIAGNOSIS — Z1329 Encounter for screening for other suspected endocrine disorder: Secondary | ICD-10-CM | POA: Diagnosis not present

## 2022-08-26 DIAGNOSIS — E7800 Pure hypercholesterolemia, unspecified: Secondary | ICD-10-CM | POA: Diagnosis not present

## 2022-08-26 NOTE — Progress Notes (Signed)
Complete physical exam  Patient: Katie Walker   DOB: Sep 06, 1955   67 y.o. Female  MRN: [id number]  Subjective:    Chief Complaint  Patient presents with   Annual Exam    Katie Walker is a 67 y.o. female who presents today for a complete physical exam. She reports consuming a general diet.  Pt is very active and works out at [employer] twice a week  She generally feels well. She reports sleeping well. She does not have additional problems to discuss today. She has increased crestor to three times a week.  Most recent fall risk assessment:    11/05/2021   10:18 AM  Fall Risk   Falls in the past year? 0  Number falls in past yr: 0  Injury with Fall? 0  Risk for fall due to : No Fall Risks  Follow up Falls evaluation completed     Most recent depression screenings:    11/05/2021   10:18 AM 07/22/2021    9:17 AM  PHQ 2/9 Scores  PHQ - 2 Score 0 0    Vision:Within last year and Dental: No current dental problems and Receives regular dental care  Patient Active Problem List   Diagnosis Date Noted   Central retinal vein occlusion with macular edema of right eye 06/30/2021   Uterine polyp 04/13/2020   Bloating 04/13/2020   Osteopenia 08/21/2019   Dysphagia 12/04/2017   Cataracts, bilateral 08/18/2017   Posterior vitreous detachment, bilateral 08/18/2017   Dermatochalasis of both upper eyelids 08/18/2017   Disorder of refraction 08/18/2017   Elevated LDL cholesterol level 12/01/2016   Fibroadenoma of both breasts 07/01/2014   Abnormal mammogram of both breasts 06/24/2014   History of colonic polyps 06/24/2014   Numbness and tingling sensation of skin 06/24/2014   Past Surgical History:  Procedure Laterality Date   BREAST BIOPSY Right    BREAST EXCISIONAL BIOPSY Right 1993   BREAST EXCISIONAL BIOPSY Left 1998   BREAST EXCISIONAL BIOPSY Right 2005   COLONOSCOPY     HYSTEROSCOPY     Family History  Problem Relation Age of Onset   Colon cancer Father    Cancer Father     Thyroid cancer Sister    Breast cancer Paternal Grandmother    Cancer Paternal Grandmother    Colon cancer Paternal Grandfather        bone   Cancer Paternal Grandfather    Cancer Maternal Grandmother        bowel   Heart disease Maternal Grandfather    Cancer Sister    No Known Allergies    Patient Care Team: Nolene Ebbs as PCP - General (Family Medicine)   Outpatient Medications Prior to Visit  Medication Sig   Aflibercept 2 MG/0.05ML SOLN by Intravitreal route.   b complex vitamins tablet Take 1 tablet by mouth daily.   Calcium Carbonate-Vitamin D (CALCIUM + D PO) Take by mouth.   Cyanocobalamin (HM SUPER VITAMIN B12 PO) Take by mouth.   famotidine (PEPCID) 20 MG tablet Take 20 mg by mouth 2 (two) times daily.   Multiple Vitamin (MULTI VITAMIN DAILY PO) Take by mouth.   Omega-3 Fatty Acids (OMEGA 3 PO) Take by mouth.   rosuvastatin (CRESTOR) 5 MG tablet TAKE 1 TABLET BY MOUTH EVERY OTHER DAY   No facility-administered medications prior to visit.    Review of Systems  All other systems reviewed and are negative.         Objective:  BP 124/73 (BP Location: Left Arm, Patient Position: Sitting, Cuff Size: Normal)   Pulse 70   Ht 5\' 4"  (1.626 m)   Wt 138 lb (62.6 kg)   SpO2 97%   BMI 23.69 kg/m  BP Readings from Last 3 Encounters:  08/26/22 124/73  07/22/21 132/80  12/01/20 137/77   Wt Readings from Last 3 Encounters:  08/26/22 138 lb (62.6 kg)  07/22/21 137 lb (62.1 kg)  12/01/20 140 lb (63.5 kg)      Physical Exam  BP 124/73 (BP Location: Left Arm, Patient Position: Sitting, Cuff Size: Normal)   Pulse 70   Ht 5\' 4"  (1.626 m)   Wt 138 lb (62.6 kg)   SpO2 97%   BMI 23.69 kg/m   General Appearance:    Alert, cooperative, no distress, appears stated age  Head:    Normocephalic, without obvious abnormality, atraumatic  Eyes:    PERRL, conjunctiva/corneas clear, EOM's intact, fundi    benign, both eyes  Ears:    Normal TM's and  external ear canals, both ears  Nose:   Nares normal, septum midline, mucosa normal, no drainage    or sinus tenderness  Throat:   Lips, mucosa, and tongue normal; teeth and gums normal  Neck:   Supple, symmetrical, trachea midline, no adenopathy;    thyroid:  no enlargement/tenderness/nodules; no carotid   bruit or JVD  Back:     Symmetric, no curvature, ROM normal, no CVA tenderness  Lungs:     Clear to auscultation bilaterally, respirations unlabored  Chest Wall:    No tenderness or deformity   Heart:    Regular rate and rhythm, S1 and S2 normal, no murmur, rub   or gallop     Abdomen:     Soft, non-tender, bowel sounds active all four quadrants,    no masses, no organomegaly        Extremities:   Extremities normal, atraumatic, no cyanosis or edema  Pulses:   2+ and symmetric all extremities  Skin:   Skin color, texture, turgor normal, no rashes or lesions  Lymph nodes:   Cervical, supraclavicular, and axillary nodes normal  Neurologic:   CNII-XII intact, normal strength, sensation and reflexes    throughout      Assessment & Plan:    Routine Health Maintenance and Physical Exam  Immunization History  Administered Date(s) Administered   Fluad Quad(high Dose 65+) 12/01/2020   Influenza,inj,Quad PF,6+ Mos 02/24/2020   PFIZER(Purple Top)SARS-COV-2 Vaccination 06/18/2019, 07/09/2019, 02/04/2020   Pneumococcal Conjugate-13 08/15/2019   Pneumococcal Polysaccharide-23 12/01/2020   Tdap 06/24/2014, 11/12/2014   Zoster Recombinat (Shingrix) 11/29/2016, 01/30/2017   Zoster, Live 06/24/2014    Health Maintenance  Topic Date Due   DEXA SCAN  08/21/2022   COVID-19 Vaccine (4 - 2023-24 season) 09/11/2022 (Originally 11/05/2021)   INFLUENZA VACCINE  10/06/2022   Medicare Annual Wellness (AWV)  11/06/2022   Colonoscopy  10/01/2023   MAMMOGRAM  05/17/2024   DTaP/Tdap/Td (3 - Td or Tdap) 11/11/2024   Pneumonia Vaccine 29+ Years old  Completed   Hepatitis C Screening  Completed    Zoster Vaccines- Shingrix  Completed   HPV VACCINES  Aged Out    Discussed health benefits of physical activity, and encouraged her to engage in regular exercise appropriate for her age and condition. [name] KitchenBonita Walker was seen today for annual exam.  Diagnoses and all orders for this visit:  Routine physical examination  Osteopenia, unspecified location -     CBC with Differential/Platelet -  DG Bone Density; Future  Thyroid disorder screen -     TSH -     CBC with Differential/Platelet  Screening for diabetes mellitus -     COMPLETE METABOLIC PANEL WITH GFR -     CBC with Differential/Platelet  Screening for lipid disorders -     Lipid Panel w/reflex Direct LDL -     CBC with Differential/Platelet   .[name] Discussed 150 minutes of exercise a week.  Encouraged vitamin D 1000 units and Calcium 1300mg  or 4 servings of dairy a day.  PHQ no concerns Vitals look great Fasting labs ordered Mammogram UTD Bone density ordered Colonoscopy UTD Vaccines UTD Follow up in 1 year  Return in about 1 year (around 08/26/2023).     Tandy Gaw, PA-C

## 2022-08-26 NOTE — Patient Instructions (Signed)

## 2022-08-27 LAB — COMPLETE METABOLIC PANEL WITH GFR
AG Ratio: 1.8 (calc) (ref 1.0–2.5)
ALT: 18 U/L (ref 6–29)
AST: 17 U/L (ref 10–35)
Albumin: 4.5 g/dL (ref 3.6–5.1)
Alkaline phosphatase (APISO): 79 U/L (ref 37–153)
BUN: 19 mg/dL (ref 7–25)
CO2: 29 mmol/L (ref 20–32)
Calcium: 10 mg/dL (ref 8.6–10.4)
Chloride: 102 mmol/L (ref 98–110)
Creat: 0.88 mg/dL (ref 0.50–1.05)
Globulin: 2.5 g/dL (calc) (ref 1.9–3.7)
Glucose, Bld: 90 mg/dL (ref 65–99)
Potassium: 4.3 mmol/L (ref 3.5–5.3)
Sodium: 141 mmol/L (ref 135–146)
Total Bilirubin: 0.5 mg/dL (ref 0.2–1.2)
Total Protein: 7 g/dL (ref 6.1–8.1)
eGFR: 72 mL/min/{1.73_m2} (ref >=60)

## 2022-08-27 LAB — CBC WITH DIFFERENTIAL/PLATELET
Absolute Monocytes: 321 cells/uL (ref 200–950)
Basophils Absolute: 31 cells/uL (ref 0–200)
Basophils Relative: 0.6 %
Eosinophils Absolute: 107 cells/uL (ref 15–500)
Eosinophils Relative: 2.1 %
HCT: 42.6 % (ref 35.0–45.0)
Hemoglobin: 14 g/dL (ref 11.7–15.5)
Lymphs Abs: 1892 cells/uL (ref 850–3900)
MCH: 29.2 pg (ref 27.0–33.0)
MCHC: 32.9 g/dL (ref 32.0–36.0)
MCV: 88.9 fL (ref 80.0–100.0)
MPV: 10.4 fL (ref 7.5–12.5)
Monocytes Relative: 6.3 %
Neutro Abs: 2749 cells/uL (ref 1500–7800)
Neutrophils Relative %: 53.9 %
Platelets: 226 10*3/uL (ref 140–400)
RBC: 4.79 10*6/uL (ref 3.80–5.10)
RDW: 12.8 % (ref 11.0–15.0)
Total Lymphocyte: 37.1 %
WBC: 5.1 10*3/uL (ref 3.8–10.8)

## 2022-08-27 LAB — TSH: TSH: 1.4 mIU/L (ref 0.40–4.50)

## 2022-08-27 LAB — LIPID PANEL W/REFLEX DIRECT LDL
Cholesterol: 203 mg/dL — ABNORMAL HIGH (ref <200)
HDL: 84 mg/dL (ref >=50)
LDL Cholesterol (Calc): 102 mg/dL (calc) — ABNORMAL HIGH
Non-HDL Cholesterol (Calc): 119 mg/dL (calc) (ref <130)
Total CHOL/HDL Ratio: 2.4 (calc) (ref <5.0)
Triglycerides: 83 mg/dL (ref <150)

## 2022-08-29 NOTE — Progress Notes (Signed)
Brayley,   Cholesterol has improved! LDL down to 102 and HDL continues to be really good. Goal LDL is under 100.  Kidney, liver, glucose look good.

## 2022-09-14 ENCOUNTER — Ambulatory Visit (INDEPENDENT_AMBULATORY_CARE_PROVIDER_SITE_OTHER): Payer: Medicare PPO

## 2022-09-14 DIAGNOSIS — M8580 Other specified disorders of bone density and structure, unspecified site: Secondary | ICD-10-CM

## 2022-09-14 DIAGNOSIS — M85851 Other specified disorders of bone density and structure, right thigh: Secondary | ICD-10-CM | POA: Diagnosis not present

## 2022-09-14 NOTE — Progress Notes (Signed)
Bone density shows low bone mass called osteopenia. Continue vitamin D at least 1000units and calcium 1200mg . Recheck in 2 years.

## 2022-10-04 DIAGNOSIS — Z1283 Encounter for screening for malignant neoplasm of skin: Secondary | ICD-10-CM | POA: Diagnosis not present

## 2022-10-04 DIAGNOSIS — L821 Other seborrheic keratosis: Secondary | ICD-10-CM | POA: Diagnosis not present

## 2022-10-04 DIAGNOSIS — L814 Other melanin hyperpigmentation: Secondary | ICD-10-CM | POA: Diagnosis not present

## 2022-10-04 DIAGNOSIS — D1801 Hemangioma of skin and subcutaneous tissue: Secondary | ICD-10-CM | POA: Diagnosis not present

## 2022-11-14 ENCOUNTER — Ambulatory Visit (INDEPENDENT_AMBULATORY_CARE_PROVIDER_SITE_OTHER): Payer: Medicare PPO | Admitting: Physician Assistant

## 2022-11-14 DIAGNOSIS — Z0000 Encounter for general adult medical examination without abnormal findings: Secondary | ICD-10-CM

## 2022-11-14 NOTE — Progress Notes (Signed)
MEDICARE ANNUAL WELLNESS VISIT  11/14/2022  Telephone Visit Disclaimer This Medicare AWV was conducted by telephone due to national recommendations for restrictions regarding the COVID-19 Pandemic (e.g. social distancing).  I verified, using two identifiers, that I am speaking with Katie Walker or their authorized healthcare agent. I discussed the limitations, risks, security, and privacy concerns of performing an evaluation and management service by telephone and the potential availability of an in-person appointment in the future. The patient expressed understanding and agreed to proceed.  Location of Patient: Home Location of Provider (nurse):  In the office.  Subjective:    Katie Walker is a 67 y.o. female patient of Caleen Essex, Lonna Cobb, PA-C who had a [employer] Visit today via telephone. Katie Walker is Retired and lives with their spouse. she has 2 children. she reports that she is socially active and does interact with friends/family regularly. she is moderately physically active and enjoys spending time with grandchildren, traveling and walking.  Patient Care Team: Nolene Ebbs as PCP - General (Family Medicine)     11/14/2022   10:27 AM 11/05/2021   10:17 AM 09/29/2020    8:58 AM 06/24/2014    3:41 PM  Advanced Directives  Does Patient Have a Medical Advance Directive? Yes Yes Yes No  Type of Advance Directive Living will Living will Living will;Healthcare Power of Attorney   Does patient want to make changes to medical advance directive? No - Patient declined No - Patient declined No - Patient declined   Copy of Healthcare Power of Attorney in Chart?   No - copy requested   Would patient like information on creating a medical advance directive?    Yes - Educational materials given    Hospital Utilization Over the Past 12 Months: # of hospitalizations or ER visits: 0 # of surgeries: 0  Review of Systems    Patient reports that her overall health is  unchanged compared to last year.  History obtained from chart review and the patient  Patient Reported Readings (BP, Pulse, CBG, Weight, etc) none Per patient no change in vitals since last visit, unable to obtain new vitals due to telehealth visit  Pain Assessment Pain : No/denies pain     Current Medications & Allergies (verified) Allergies as of 11/14/2022   No Known Allergies      Medication List        Accurate as of November 14, 2022 10:39 AM. If you have any questions, ask your nurse or doctor.          Aflibercept 2 MG/0.05ML Soln by Intravitreal route.   b complex vitamins tablet Take 1 tablet by mouth daily.   CALCIUM + D PO Take by mouth.   famotidine 20 MG tablet Commonly known as: PEPCID Take 20 mg by mouth 2 (two) times daily.   HM SUPER VITAMIN B12 PO Take by mouth.   MULTI VITAMIN DAILY PO Take by mouth.   OMEGA 3 PO Take by mouth.   rosuvastatin 5 MG tablet Commonly known as: CRESTOR TAKE 1 TABLET BY MOUTH EVERY OTHER DAY        History (reviewed): Past Medical History:  Diagnosis Date   Arthritis Feb. 8, 2022   Breast disease    Cataracts, bilateral 08/18/2017   Endometrial polyp    GERD (gastroesophageal reflux disease)    Uterine fibroid    Past Surgical History:  Procedure Laterality Date   BREAST BIOPSY Right    BREAST EXCISIONAL BIOPSY  Right 1993   BREAST EXCISIONAL BIOPSY Left 1998   BREAST EXCISIONAL BIOPSY Right 2005   COLONOSCOPY     HYSTEROSCOPY     Family History  Problem Relation Age of Onset   Colon cancer Father    Cancer Father    Thyroid cancer Sister    Breast cancer Paternal Grandmother    Cancer Paternal Grandmother    Colon cancer Paternal Grandfather        bone   Cancer Paternal Grandfather    Cancer Maternal Grandmother        bowel   Heart disease Maternal Grandfather    Cancer Sister    Social History   Socioeconomic History   Marital status: Married    Spouse name: Channing Mutters "Buzz"  Sheckler   Number of children: 2   Years of education: 16   Highest education level: Bachelor's degree (e.g., BA, AB, BS)  Occupational History   Occupation: homemaker   Occupation: retired  Tobacco Use   Smoking status: Never   Smokeless tobacco: Never  Vaping Use   Vaping status: Never Used  Substance and Sexual Activity   Alcohol use: Not Currently    Comment: 1 glass of wine twice a month   Drug use: No   Sexual activity: Yes    Partners: Male    Birth control/protection: Post-menopausal, None    Comment: Partner: Male  Other Topics Concern   Not on file  Social History Narrative   Lives with her husband. Enjoys reading, spending time with grandchildren, traveling and walking.   Goes to the [facility], twice a week and walks a few times a week.    Social Determinants of Health   Financial Resource Strain: Low Risk  (11/10/2022)   Overall Financial Resource Strain (CARDIA)    Difficulty of Paying Living Expenses: Not hard at all  Food Insecurity: No Food Insecurity (11/10/2022)   Hunger Vital Sign    Worried About Running Out of Food in the Last Year: Never true    Ran Out of Food in the Last Year: Never true  Transportation Needs: No Transportation Needs (11/10/2022)   PRAPARE - Administrator, Civil Service (Medical): No    Lack of Transportation (Non-Medical): No  Physical Activity: Insufficiently Active (11/10/2022)   Exercise Vital Sign    Days of Exercise per Week: 3 days    Minutes of Exercise per Session: 40 min  Stress: No Stress Concern Present (11/10/2022)   [employer] of Occupational Health - Occupational Stress Questionnaire    Feeling of Stress : Not at all  Social Connections: Socially Integrated (11/14/2022)   Social Connection and Isolation Panel [NHANES]    Frequency of Communication with Friends and Family: More than three times a week    Frequency of Social Gatherings with Friends and Family: Three times a week    Attends Religious Services:  More than 4 times per year    Active Member of Clubs or Organizations: Yes    Attends Banker Meetings: More than 4 times per year    Marital Status: Married    Activities of Daily Living    11/10/2022    8:10 PM  In your present state of health, do you have any difficulty performing the following activities:  Hearing? 0  Vision? 0  Difficulty concentrating or making decisions? 0  Walking or climbing stairs? 0  Dressing or bathing? 0  Doing errands, shopping? 0  Preparing Food and eating ?  N  Using the Toilet? N  In the past six months, have you accidently leaked urine? N  Do you have problems with loss of bowel control? N  Managing your Medications? N  Managing your Finances? N  Housekeeping or managing your Housekeeping? N    Patient Education/ Literacy How often do you need to have someone help you when you read instructions, pamphlets, or other written materials from your doctor or pharmacy?: 1 - Never What is the last grade level you completed in school?: Bachelor's degree  Exercise    Diet Patient reports consuming 3 meals a day and 0 snack(s) a day Patient reports that her primary diet is: Regular Patient reports that she does have regular access to food.   Depression Screen    11/14/2022   10:27 AM 11/05/2021   10:18 AM 07/22/2021    9:17 AM 09/29/2020    9:00 AM 05/15/2020    1:29 PM 05/10/2019    3:59 PM 11/30/2017    8:47 AM  PHQ 2/9 Scores  PHQ - 2 Score 0 0 0 0 0 0 0  PHQ- 9 Score    0  2 1     Fall Risk    11/14/2022   10:27 AM 11/10/2022    8:10 PM 11/05/2021   10:18 AM 11/01/2021    3:34 PM 09/29/2020    9:00 AM  Fall Risk   Falls in the past year? 0 0 0 0 0  Number falls in past yr: 0 0 0 0 0  Injury with Fall? 0 0 0 0 0  Risk for fall due to : No Fall Risks  No Fall Risks  No Fall Risks  Follow up Falls evaluation completed  Falls evaluation completed  Falls evaluation completed     Objective:  Maylen Hamman seemed alert and oriented  and she participated appropriately during our telephone visit.  Blood Pressure Weight BMI  BP Readings from Last 3 Encounters:  08/26/22 124/73  07/22/21 132/80  12/01/20 137/77   Wt Readings from Last 3 Encounters:  08/26/22 138 lb (62.6 kg)  07/22/21 137 lb (62.1 kg)  12/01/20 140 lb (63.5 kg)   BMI Readings from Last 1 Encounters:  08/26/22 23.69 kg/m    *Unable to obtain current vital signs, weight, and BMI due to telephone visit type  Hearing/Vision  Katie Walker did not seem to have difficulty with hearing/understanding during the telephone conversation Reports that she has had a formal eye exam by an eye care professional within the past year Reports that she has not had a formal hearing evaluation within the past year *Unable to fully assess hearing and vision during telephone visit type  Cognitive Function:    11/14/2022   10:32 AM 11/05/2021   10:24 AM 09/29/2020    9:11 AM  6CIT Screen  What Year? 0 points 0 points 0 points  What month? 0 points 0 points 0 points  What time? 0 points 0 points 0 points  Count back from 20 0 points 0 points 0 points  Months in reverse 0 points 0 points 0 points  Repeat phrase 0 points 0 points 0 points  Total Score 0 points 0 points 0 points   (Normal:0-7, Significant for Dysfunction: >8)  Normal Cognitive Function Screening: Yes   Immunization & Health Maintenance Record Immunization History  Administered Date(s) Administered   Fluad Quad(high Dose 65+) 12/01/2020   Influenza,inj,Quad PF,6+ Mos 02/24/2020   PFIZER(Purple Top)SARS-COV-2 Vaccination 06/18/2019, 07/09/2019, 02/04/2020  Pneumococcal Conjugate-13 08/15/2019   Pneumococcal Polysaccharide-23 12/01/2020   Tdap 06/24/2014, 11/12/2014   Zoster Recombinant(Shingrix) 11/29/2016, 01/30/2017   Zoster, Live 06/24/2014    Health Maintenance  Topic Date Due   COVID-19 Vaccine (4 - 2023-24 season) 11/30/2022 (Originally 11/06/2022)   INFLUENZA VACCINE  06/05/2023 (Originally  10/06/2022)   Colonoscopy  10/01/2023   Medicare Annual Wellness (AWV)  11/14/2023   MAMMOGRAM  05/17/2024   DTaP/Tdap/Td (3 - Td or Tdap) 11/11/2024   DEXA SCAN  09/13/2025   Pneumonia Vaccine 37+ Years old  Completed   Hepatitis C Screening  Completed   Zoster Vaccines- Shingrix  Completed   HPV VACCINES  Aged Out       Assessment  This is a routine wellness examination for [employer].  Health Maintenance: Due or Overdue There are no preventive care reminders to display for this patient.   Katie Walker does not need a referral for Community Assistance: Care Management:   no Social Work:    no Prescription Assistance:  no Nutrition/Diabetes Education:  no   Plan:  Personalized Goals  Goals Addressed               This Visit's Progress     Patient Stated (pt-stated)        Patient stated that she would like to 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       Personalized Health Maintenance & Screening Recommendations  Influenza vaccine - patient declined  Lung Cancer Screening Recommended: no (Low Dose CT Chest recommended if Age 58-80 years, 20 pack-year currently smoking OR have quit w/in past 15 years) Hepatitis C Screening recommended: no HIV Screening recommended: no  Advanced Directives: Written information was not prepared per patient's request.  Referrals & Orders No orders of the defined types were placed in this encounter.   Follow-up Plan Follow-up with Jomarie Longs, PA-C as planned Medicare wellness visit in one year.  Patient will access AVS on my chart.   I have personally reviewed and noted the following in the patient's chart:   Medical and social history Use of alcohol, tobacco or illicit drugs  Current medications and supplements Functional ability and status Nutritional status Physical activity Advanced directives List of other physicians Hospitalizations, surgeries, and ER visits in previous 12  months Vitals Screenings to include cognitive, depression, and falls Referrals and appointments  In addition, I have reviewed and discussed with Katie Walker certain preventive protocols, quality metrics, and best practice recommendations. A written personalized care plan for preventive services as well as general preventive health recommendations is available and can be mailed to the patient at her request.      Modesto Charon, RN BSN  11/14/2022

## 2022-11-14 NOTE — Patient Instructions (Addendum)
MEDICARE ANNUAL WELLNESS VISIT Health Maintenance Summary and Written Plan of Care  Katie Walker ,  Thank you for allowing me to perform your Medicare Annual Wellness Visit and for your ongoing commitment to your health.   Health Maintenance & Immunization History Health Maintenance  Topic Date Due   COVID-19 Vaccine (4 - 2023-24 season) 11/30/2022 (Originally 11/06/2022)   INFLUENZA VACCINE  06/05/2023 (Originally 10/06/2022)   Colonoscopy  10/01/2023   Medicare Annual Wellness (AWV)  11/14/2023   MAMMOGRAM  05/17/2024   DTaP/Tdap/Td (3 - Td or Tdap) 11/11/2024   DEXA SCAN  09/13/2025   Pneumonia Vaccine 35+ Years old  Completed   Hepatitis C Screening  Completed   Zoster Vaccines- Shingrix  Completed   HPV VACCINES  Aged Out   Immunization History  Administered Date(s) Administered   Fluad Quad(high Dose 65+) 12/01/2020   Influenza,inj,Quad PF,6+ Mos 02/24/2020   PFIZER(Purple Top)SARS-COV-2 Vaccination 06/18/2019, 07/09/2019, 02/04/2020   Pneumococcal Conjugate-13 08/15/2019   Pneumococcal Polysaccharide-23 12/01/2020   Tdap 06/24/2014, 11/12/2014   Zoster Recombinant(Shingrix) 11/29/2016, 01/30/2017   Zoster, Live 06/24/2014    These are the patient goals that we discussed:  Goals Addressed               This Visit's Progress     Patient Stated (pt-stated)        Patient stated that she would like to 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         This is a list of Health Maintenance Items that are overdue or due now: There are no preventive care reminders to display for this patient.   Orders/Referrals Placed Today: No orders of the defined types were placed in this encounter.  (Contact our referral department at [phone] if you have not spoken with someone about your referral appointment within the next 5 days)    Follow-up Plan Follow-up with Jomarie Longs, PA-C as planned Medicare wellness visit in one year.  Patient will access  AVS on my chart.      Health Maintenance, Female Adopting a healthy lifestyle and getting preventive care are important in promoting health and wellness. Ask your health care provider about: The right schedule for you to have regular tests and exams. Things you can do on your own to prevent diseases and keep yourself healthy. What should I know about diet, weight, and exercise? Eat a healthy diet  Eat a diet that includes plenty of vegetables, fruits, low-fat dairy products, and lean protein. Do not eat a lot of foods that are high in solid fats, added sugars, or sodium. Maintain a healthy weight Body mass index (BMI) is used to identify weight problems. It estimates body fat based on height and weight. Your health care provider can help determine your BMI and help you achieve or maintain a healthy weight. Get regular exercise Get regular exercise. This is one of the most important things you can do for your health. Most adults should: Exercise for at least 150 minutes each week. The exercise should increase your heart rate and make you sweat (moderate-intensity exercise). Do strengthening exercises at least twice a week. This is in addition to the moderate-intensity exercise. Spend less time sitting. Even light physical activity can be beneficial. Watch cholesterol and blood lipids Have your blood tested for lipids and cholesterol at 68 years of age, then have this test every 5 years. Have your cholesterol levels checked more often if: Your lipid or cholesterol levels are high. You  are older than 68 years of age. You are at high risk for heart disease. What should I know about cancer screening? Depending on your health history and family history, you may need to have cancer screening at various ages. This may include screening for: Breast cancer. Cervical cancer. Colorectal cancer. Skin cancer. Lung cancer. What should I know about heart disease, diabetes, and high blood  pressure? Blood pressure and heart disease High blood pressure causes heart disease and increases the risk of stroke. This is more likely to develop in people who have high blood pressure readings or are overweight. Have your blood pressure checked: Every 3-5 years if you are 83-59 years of age. Every year if you are 79 years old or older. Diabetes Have regular diabetes screenings. This checks your fasting blood sugar level. Have the screening done: Once every three years after age 36 if you are at a normal weight and have a low risk for diabetes. More often and at a younger age if you are overweight or have a high risk for diabetes. What should I know about preventing infection? Hepatitis B If you have a higher risk for hepatitis B, you should be screened for this virus. Talk with your health care provider to find out if you are at risk for hepatitis B infection. Hepatitis C Testing is recommended for: Everyone born from [birth year] through 1966. Anyone with known risk factors for hepatitis C. Sexually transmitted infections (STIs) Get screened for STIs, including gonorrhea and chlamydia, if: You are sexually active and are younger than 68 years of age. You are older than 68 years of age and your health care provider tells you that you are at risk for this type of infection. Your sexual activity has changed since you were last screened, and you are at increased risk for chlamydia or gonorrhea. Ask your health care provider if you are at risk. Ask your health care provider about whether you are at high risk for HIV. Your health care provider may recommend a prescription medicine to help prevent HIV infection. If you choose to take medicine to prevent HIV, you should first get tested for HIV. You should then be tested every 3 months for as long as you are taking the medicine. Pregnancy If you are about to stop having your period (premenopausal) and you may become pregnant, seek counseling before you  get pregnant. Take 400 to 800 micrograms (mcg) of folic acid every day if you become pregnant. Ask for birth control (contraception) if you want to prevent pregnancy. Osteoporosis and menopause Osteoporosis is a disease in which the bones lose minerals and strength with aging. This can result in bone fractures. If you are 68 years old or older, or if you are at risk for osteoporosis and fractures, ask your health care provider if you should: Be screened for bone loss. Take a calcium or vitamin D supplement to lower your risk of fractures. Be given hormone replacement therapy (HRT) to treat symptoms of menopause. Follow these instructions at home: Alcohol use Do not drink alcohol if: Your health care provider tells you not to drink. You are pregnant, may be pregnant, or are planning to become pregnant. If you drink alcohol: Limit how much you have to: 0-1 drink a day. Know how much alcohol is in your drink. In the [place] one drink equals one 12 oz bottle of beer (355 mL), one 5 oz glass of wine (148 mL), or one 1 oz glass of hard liquor (44 mL).  Lifestyle Do not use any products that contain nicotine or tobacco. These products include cigarettes, chewing tobacco, and vaping devices, such as e-cigarettes. If you need help quitting, ask your health care provider. Do not use street drugs. Do not share needles. Ask your health care provider for help if you need support or information about quitting drugs. General instructions Schedule regular health, dental, and eye exams. Stay current with your vaccines. Tell your health care provider if: You often feel depressed. You have ever been abused or do not feel safe at home. Summary Adopting a healthy lifestyle and getting preventive care are important in promoting health and wellness. Follow your health care provider's instructions about healthy diet, exercising, and getting tested or screened for diseases. Follow your health care provider's  instructions on monitoring your cholesterol and blood pressure. This information is not intended to replace advice given to you by your health care provider. Make sure you discuss any questions you have with your health care provider. Document Revised: 07/13/2020 Document Reviewed: 07/13/2020 Elsevier Patient Education  2024 [employer].

## 2022-12-23 DIAGNOSIS — H35033 Hypertensive retinopathy, bilateral: Secondary | ICD-10-CM | POA: Diagnosis not present

## 2022-12-23 DIAGNOSIS — H43813 Vitreous degeneration, bilateral: Secondary | ICD-10-CM | POA: Diagnosis not present

## 2022-12-23 DIAGNOSIS — H43393 Other vitreous opacities, bilateral: Secondary | ICD-10-CM | POA: Diagnosis not present

## 2022-12-23 DIAGNOSIS — H34811 Central retinal vein occlusion, right eye, with macular edema: Secondary | ICD-10-CM | POA: Diagnosis not present

## 2023-01-30 DIAGNOSIS — H25813 Combined forms of age-related cataract, bilateral: Secondary | ICD-10-CM | POA: Diagnosis not present

## 2023-01-30 DIAGNOSIS — H02831 Dermatochalasis of right upper eyelid: Secondary | ICD-10-CM | POA: Diagnosis not present

## 2023-01-30 DIAGNOSIS — H02834 Dermatochalasis of left upper eyelid: Secondary | ICD-10-CM | POA: Diagnosis not present

## 2023-01-30 DIAGNOSIS — H35371 Puckering of macula, right eye: Secondary | ICD-10-CM | POA: Diagnosis not present

## 2023-01-30 DIAGNOSIS — H348112 Central retinal vein occlusion, right eye, stable: Secondary | ICD-10-CM | POA: Diagnosis not present

## 2023-01-30 DIAGNOSIS — H526 Other disorders of refraction: Secondary | ICD-10-CM | POA: Diagnosis not present

## 2023-04-07 ENCOUNTER — Telehealth: Payer: Self-pay

## 2023-04-07 ENCOUNTER — Other Ambulatory Visit: Payer: Self-pay

## 2023-04-07 DIAGNOSIS — Z1239 Encounter for other screening for malignant neoplasm of breast: Secondary | ICD-10-CM

## 2023-04-07 NOTE — Telephone Encounter (Signed)
Patient informed  Mammogram orders placed in patient chart  She will contact radiology for scheduling annual screening MMG .

## 2023-04-07 NOTE — Telephone Encounter (Signed)
Copied from CRM [id number]. Topic: Clinical - Request for Lab/Test Order >> Apr 04, 2023  4:09 PM Geroge Baseman wrote: Reason for CRM: Patient wants to know if it is time for her annual mammogram and she is asking if the order can be put in for one if it is time. Please advise the patient.

## 2023-05-02 DIAGNOSIS — H43813 Vitreous degeneration, bilateral: Secondary | ICD-10-CM | POA: Diagnosis not present

## 2023-05-02 DIAGNOSIS — H35033 Hypertensive retinopathy, bilateral: Secondary | ICD-10-CM | POA: Diagnosis not present

## 2023-05-02 DIAGNOSIS — H43393 Other vitreous opacities, bilateral: Secondary | ICD-10-CM | POA: Diagnosis not present

## 2023-05-02 DIAGNOSIS — H34811 Central retinal vein occlusion, right eye, with macular edema: Secondary | ICD-10-CM | POA: Diagnosis not present

## 2023-05-31 ENCOUNTER — Ambulatory Visit: Payer: Medicare PPO

## 2023-05-31 DIAGNOSIS — Z1239 Encounter for other screening for malignant neoplasm of breast: Secondary | ICD-10-CM

## 2023-05-31 DIAGNOSIS — Z1231 Encounter for screening mammogram for malignant neoplasm of breast: Secondary | ICD-10-CM | POA: Diagnosis not present

## 2023-06-02 ENCOUNTER — Encounter: Payer: Self-pay | Admitting: Physician Assistant

## 2023-06-02 NOTE — Progress Notes (Signed)
 Normal mammogram. Follow up in 1 year.

## 2023-07-31 ENCOUNTER — Other Ambulatory Visit: Payer: Self-pay | Admitting: Physician Assistant

## 2023-07-31 DIAGNOSIS — E7800 Pure hypercholesterolemia, unspecified: Secondary | ICD-10-CM

## 2023-08-28 ENCOUNTER — Ambulatory Visit (INDEPENDENT_AMBULATORY_CARE_PROVIDER_SITE_OTHER): Admitting: Physician Assistant

## 2023-08-28 VITALS — BP 133/70 | HR 64 | Ht 64.0 in | Wt 137.0 lb

## 2023-08-28 DIAGNOSIS — Z0000 Encounter for general adult medical examination without abnormal findings: Secondary | ICD-10-CM

## 2023-08-28 DIAGNOSIS — H34811 Central retinal vein occlusion, right eye, with macular edema: Secondary | ICD-10-CM

## 2023-08-28 DIAGNOSIS — M8580 Other specified disorders of bone density and structure, unspecified site: Secondary | ICD-10-CM | POA: Diagnosis not present

## 2023-08-28 DIAGNOSIS — E7800 Pure hypercholesterolemia, unspecified: Secondary | ICD-10-CM | POA: Diagnosis not present

## 2023-08-28 DIAGNOSIS — Z780 Asymptomatic menopausal state: Secondary | ICD-10-CM

## 2023-08-28 NOTE — Patient Instructions (Signed)
 Health Maintenance After Age 69 After age 4, you are at a higher risk for certain long-term diseases and infections as well as injuries from falls. Falls are a major cause of broken bones and head injuries in people who are older than age 47. Getting regular preventive care can help to keep you healthy and well. Preventive care includes getting regular testing and making lifestyle changes as recommended by your health care provider. Talk with your health care provider about: Which screenings and tests you should have. A screening is a test that checks for a disease when you have no symptoms. A diet and exercise plan that is right for you. What should I know about screenings and tests to prevent falls? Screening and testing are the best ways to find a health problem early. Early diagnosis and treatment give you the best chance of managing medical conditions that are common after age 37. Certain conditions and lifestyle choices may make you more likely to have a fall. Your health care provider may recommend: Regular vision checks. Poor vision and conditions such as cataracts can make you more likely to have a fall. If you wear glasses, make sure to get your prescription updated if your vision changes. Medicine review. Work with your health care provider to regularly review all of the medicines you are taking, including over-the-counter medicines. Ask your health care provider about any side effects that may make you more likely to have a fall. Tell your health care provider if any medicines that you take make you feel dizzy or sleepy. Strength and balance checks. Your health care provider may recommend certain tests to check your strength and balance while standing, walking, or changing positions. Foot health exam. Foot pain and numbness, as well as not wearing proper footwear, can make you more likely to have a fall. Screenings, including: Osteoporosis screening. Osteoporosis is a condition that causes  the bones to get weaker and break more easily. Blood pressure screening. Blood pressure changes and medicines to control blood pressure can make you feel dizzy. Depression screening. You may be more likely to have a fall if you have a fear of falling, feel depressed, or feel unable to do activities that you used to do. Alcohol use screening. Using too much alcohol can affect your balance and may make you more likely to have a fall. Follow these instructions at home: Lifestyle Do not drink alcohol if: Your health care provider tells you not to drink. If you drink alcohol: Limit how much you have to: 0-1 drink a day for women. 0-2 drinks a day for men. Know how much alcohol is in your drink. In the [place] one drink equals one 12 oz bottle of beer (355 mL), one 5 oz glass of wine (148 mL), or one 1 oz glass of hard liquor (44 mL). Do not use any products that contain nicotine or tobacco. These products include cigarettes, chewing tobacco, and vaping devices, such as e-cigarettes. If you need help quitting, ask your health care provider. Activity  Follow a regular exercise program to stay fit. This will help you maintain your balance. Ask your health care provider what types of exercise are appropriate for you. If you need a cane or walker, use it as recommended by your health care provider. Wear supportive shoes that have nonskid soles. Safety  Remove any tripping hazards, such as rugs, cords, and clutter. Install safety equipment such as grab bars in bathrooms and safety rails on stairs. Keep rooms and walkways  well-lit. General instructions Talk with your health care provider about your risks for falling. Tell your health care provider if: You fall. Be sure to tell your health care provider about all falls, even ones that seem minor. You feel dizzy, tiredness (fatigue), or off-balance. Take over-the-counter and prescription medicines only as told by your health care provider. These include  supplements. Eat a healthy diet and maintain a healthy weight. A healthy diet includes low-fat dairy products, low-fat (lean) meats, and fiber from whole grains, beans, and lots of fruits and vegetables. Stay current with your vaccines. Schedule regular health, dental, and eye exams. Summary Having a healthy lifestyle and getting preventive care can help to protect your health and wellness after age 11. Screening and testing are the best way to find a health problem early and help you avoid having a fall. Early diagnosis and treatment give you the best chance for managing medical conditions that are more common for people who are older than age 28. Falls are a major cause of broken bones and head injuries in people who are older than age 48. Take precautions to prevent a fall at home. Work with your health care provider to learn what changes you can make to improve your health and wellness and to prevent falls. This information is not intended to replace advice given to you by your health care provider. Make sure you discuss any questions you have with your health care provider. Document Revised: 07/13/2020 Document Reviewed: 07/13/2020 Elsevier Patient Education  2024 [employer].

## 2023-08-29 ENCOUNTER — Encounter: Payer: Self-pay | Admitting: Physician Assistant

## 2023-08-29 ENCOUNTER — Ambulatory Visit: Payer: Self-pay | Admitting: Physician Assistant

## 2023-08-29 LAB — CMP14+EGFR
ALT: 15 IU/L (ref 0–32)
AST: 18 IU/L (ref 0–40)
Albumin: 4.4 g/dL (ref 3.9–4.9)
Alkaline Phosphatase: 79 IU/L (ref 44–121)
BUN/Creatinine Ratio: 17 (ref 12–28)
BUN: 16 mg/dL (ref 8–27)
Bilirubin Total: 0.4 mg/dL (ref 0.0–1.2)
CO2: 22 mmol/L (ref 20–29)
Calcium: 9.8 mg/dL (ref 8.7–10.3)
Chloride: 103 mmol/L (ref 96–106)
Creatinine, Ser: 0.92 mg/dL (ref 0.57–1.00)
Globulin, Total: 2 g/dL (ref 1.5–4.5)
Glucose: 90 mg/dL (ref 70–99)
Potassium: 4.1 mmol/L (ref 3.5–5.2)
Sodium: 141 mmol/L (ref 134–144)
Total Protein: 6.4 g/dL (ref 6.0–8.5)
eGFR: 67 mL/min/{1.73_m2} (ref >59)

## 2023-08-29 LAB — VITAMIN D 25 HYDROXY (VIT D DEFICIENCY, FRACTURES): Vit D, 25-Hydroxy: 64.9 ng/mL (ref 30.0–100.0)

## 2023-08-29 LAB — CBC WITH DIFFERENTIAL/PLATELET
Basophils Absolute: 0 10*3/uL (ref 0.0–0.2)
Basos: 1 %
EOS (ABSOLUTE): 0.1 10*3/uL (ref 0.0–0.4)
Eos: 2 %
Hematocrit: 39.8 % (ref 34.0–46.6)
Hemoglobin: 13.1 g/dL (ref 11.1–15.9)
Immature Grans (Abs): 0 10*3/uL (ref 0.0–0.1)
Immature Granulocytes: 0 %
Lymphocytes Absolute: 1.7 10*3/uL (ref 0.7–3.1)
Lymphs: 40 %
MCH: 29.5 pg (ref 26.6–33.0)
MCHC: 32.9 g/dL (ref 31.5–35.7)
MCV: 90 fL (ref 79–97)
Monocytes Absolute: 0.3 10*3/uL (ref 0.1–0.9)
Monocytes: 7 %
Neutrophils Absolute: 2.2 10*3/uL (ref 1.4–7.0)
Neutrophils: 50 %
Platelets: 209 10*3/uL (ref 150–450)
RBC: 4.44 x10E6/uL (ref 3.77–5.28)
RDW: 12.9 % (ref 11.7–15.4)
WBC: 4.3 10*3/uL (ref 3.4–10.8)

## 2023-08-29 LAB — LIPID PANEL
Chol/HDL Ratio: 2.5 ratio (ref 0.0–4.4)
Cholesterol, Total: 187 mg/dL (ref 100–199)
HDL: 74 mg/dL (ref >39)
LDL Chol Calc (NIH): 93 mg/dL (ref 0–99)
Triglycerides: 114 mg/dL (ref 0–149)
VLDL Cholesterol Cal: 20 mg/dL (ref 5–40)

## 2023-08-29 LAB — TSH: TSH: 0.876 u[IU]/mL (ref 0.450–4.500)

## 2023-08-29 NOTE — Progress Notes (Signed)
 Complete physical exam  Patient: Katie Walker   DOB: 1955-08-06   68 y.o. Female  MRN: [id number]  Subjective:    Chief Complaint  Patient presents with   Annual Exam    Katie Walker is a 68 y.o. female who presents today for a complete physical exam. She reports consuming a general diet. Pt is very active with walking and some strength training. She generally feels well. She reports sleeping well. She does not have additional problems to discuss today.    Most recent fall risk assessment:    08/29/2023    6:58 AM  Fall Risk   Falls in the past year? 0  Number falls in past yr: 0  Injury with Fall? 0  Risk for fall due to : No Fall Risks  Follow up Falls evaluation completed     Most recent depression screenings:    08/29/2023    6:58 AM 11/14/2022   10:27 AM  PHQ 2/9 Scores  PHQ - 2 Score 0 0    Vision:Within last year and Dental: No current dental problems and Receives regular dental care  Patient Active Problem List   Diagnosis Date Noted   Central retinal vein occlusion with macular edema of right eye 06/30/2021   Uterine polyp 04/13/2020   Bloating 04/13/2020   Osteopenia 08/21/2019   Dysphagia 12/04/2017   Cataracts, bilateral 08/18/2017   Posterior vitreous detachment, bilateral 08/18/2017   Dermatochalasis of both upper eyelids 08/18/2017   Disorder of refraction 08/18/2017   Elevated LDL cholesterol level 12/01/2016   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 8, 2022   Breast disease    Cataracts, bilateral 08/18/2017   Endometrial polyp    GERD (gastroesophageal reflux disease)    Uterine fibroid    Past Surgical History:  Procedure Laterality Date   BREAST BIOPSY Right    BREAST EXCISIONAL BIOPSY Right 1993   BREAST EXCISIONAL BIOPSY Left  1998   BREAST EXCISIONAL BIOPSY Right 2005   COLONOSCOPY     HYSTEROSCOPY     No Known Allergies    Patient Care Team: Tiauna Whisnant L, PA-C as PCP - General (Family Medicine)   Outpatient Medications Prior to Visit  Medication Sig   Aflibercept  2 MG/0.05ML SOLN by Intravitreal route.   b complex vitamins tablet Take 1 tablet by mouth daily.   Calcium  Carbonate-Vitamin D (CALCIUM  + D PO) Take by mouth.   Cyanocobalamin (HM SUPER VITAMIN B12 PO) Take by mouth.   famotidine (PEPCID) 20 MG tablet Take 20 mg by mouth 2 (two) times daily.   Multiple Vitamin (MULTI VITAMIN DAILY PO) Take by mouth.   Omega-3 Fatty Acids (OMEGA 3 PO) Take by mouth.   rosuvastatin  (CRESTOR ) 5 MG tablet TAKE 1 TABLET BY MOUTH EVERY OTHER DAY   No facility-administered medications prior to visit.    Review of Systems  All other systems reviewed and are negative.         Objective:     BP 133/70   Pulse 64   Ht 5' 4 (1.626 m)   Wt 137 lb (62.1 kg)   SpO2 100%   BMI 23.52 kg/m  BP Readings from Last 3 Encounters:  08/28/23 133/70  08/26/22 124/73  07/22/21 132/80   Wt Readings from Last 3 Encounters:  08/28/23 137 lb (62.1 kg)  08/26/22 138 lb (62.6 kg)  07/22/21 137 lb (62.1 kg)      Physical Exam  BP 133/70   Pulse 64   Ht 5' 4 (1.626 m)   Wt 137 lb (62.1 kg)   SpO2 100%   BMI 23.52 kg/m   General Appearance:    Alert, cooperative, no distress, appears stated age  Head:    Normocephalic, without obvious abnormality, atraumatic  Eyes:    PERRL, conjunctiva/corneas clear, EOM's intact, fundi    benign, both eyes  Ears:    Normal TM's and external ear canals, both ears  Nose:   Nares normal, septum midline, mucosa normal, no drainage    or sinus tenderness  Throat:   Lips, mucosa, and tongue normal; teeth and gums normal  Neck:   Supple, symmetrical, trachea midline, no adenopathy;    thyroid:  no enlargement/tenderness/nodules; no carotid   bruit or JVD  Back:      Symmetric, no curvature, ROM normal, no CVA tenderness  Lungs:     Clear to auscultation bilaterally, respirations unlabored  Chest Wall:    No tenderness or deformity   Heart:    Regular rate and rhythm, S1 and S2 normal, no murmur, rub   or gallop     Abdomen:     Soft, non-tender, bowel sounds active all four quadrants,    no masses, no organomegaly        Extremities:   Extremities normal, atraumatic, no cyanosis or edema  Pulses:   2+ and symmetric all extremities  Skin:   Skin color, texture, turgor normal, no rashes or lesions  Lymph nodes:   Cervical, supraclavicular, and axillary nodes normal  Neurologic:   CNII-XII intact, normal strength, sensation and reflexes    throughout      Assessment & Plan:    Routine Health Maintenance and Physical Exam  Immunization History  Administered Date(s) Administered   Fluad Quad(high Dose 65+) 12/01/2020   Influenza,inj,Quad PF,6+ Mos 02/24/2020   PFIZER(Purple Top)SARS-COV-2 Vaccination 06/18/2019, 07/09/2019, 02/04/2020   Pneumococcal Conjugate-13 08/15/2019   Pneumococcal Polysaccharide-23 12/01/2020   Tdap 06/24/2014, 11/12/2014   Zoster Recombinant(Shingrix ) 11/29/2016, 01/30/2017   Zoster, Live 06/24/2014    Health Maintenance  Topic Date Due   Colonoscopy  10/01/2023   COVID-19 Vaccine (4 - 2024-25 season) 09/13/2023 (Originally 11/06/2022)   INFLUENZA VACCINE  10/06/2023   Medicare Annual Wellness (AWV)  11/14/2023   DTaP/Tdap/Td (3 - Td or Tdap) 11/11/2024   MAMMOGRAM  05/30/2025   DEXA SCAN  09/13/2025   Pneumococcal Vaccine: 50+ Years  Completed   Hepatitis C Screening  Completed   Zoster Vaccines- Shingrix   Completed   HPV VACCINES  Aged Out   Meningococcal B Vaccine  Aged Out    Discussed health benefits of physical activity, and encouraged her to engage in regular exercise appropriate for her age and condition. SABRADerwood was seen today for annual exam.  Diagnoses and all orders for this visit:  Routine  physical examination -     CBC with Differential/Platelet -     CMP14+EGFR -     Lipid panel -     TSH -     VITAMIN D 25 Hydroxy (Vit-D Deficiency, Fractures)  Elevated LDL cholesterol level -     Lipid panel  Post-menopausal -     VITAMIN D 25 Hydroxy (Vit-D Deficiency, Fractures)  Osteopenia, unspecified location -     VITAMIN D 25 Hydroxy (Vit-D Deficiency,  Fractures)  Central retinal vein occlusion with macular edema of right eye   .SABRA Discussed 150 minutes of exercise a week.  Encouraged vitamin D 1000 units and Calcium  1300mg  or 4 servings of dairy a day.  Fasting labs ordered today No falls No PHQ concerns Pap not indicated Mammogram UTD Bone density UTD Colonoscopy UTD Pt is being followed by ophthalmology and macular edema is controlled with biologics of right eye Vaccines UTD Follow up in 1 year  Return in about 1 year (around 08/27/2024).     Royce Stegman, PA-C

## 2023-08-29 NOTE — Progress Notes (Signed)
 Lona,   Kidney, liver, glucose look great.  Cholesterol is SO MUCH better. Your LDL is optimal under 100. Your HDL is wonderful.  Continue crestor  3 days a week!  Thyroid looks good.  Vitamin D looks GREAT.

## 2023-09-05 DIAGNOSIS — H43393 Other vitreous opacities, bilateral: Secondary | ICD-10-CM | POA: Diagnosis not present

## 2023-09-05 DIAGNOSIS — H43813 Vitreous degeneration, bilateral: Secondary | ICD-10-CM | POA: Diagnosis not present

## 2023-09-05 DIAGNOSIS — H35033 Hypertensive retinopathy, bilateral: Secondary | ICD-10-CM | POA: Diagnosis not present

## 2023-09-05 DIAGNOSIS — H34811 Central retinal vein occlusion, right eye, with macular edema: Secondary | ICD-10-CM | POA: Diagnosis not present

## 2023-10-05 DIAGNOSIS — D1801 Hemangioma of skin and subcutaneous tissue: Secondary | ICD-10-CM | POA: Diagnosis not present

## 2023-10-05 DIAGNOSIS — L820 Inflamed seborrheic keratosis: Secondary | ICD-10-CM | POA: Diagnosis not present

## 2023-10-05 DIAGNOSIS — L814 Other melanin hyperpigmentation: Secondary | ICD-10-CM | POA: Diagnosis not present

## 2023-10-17 DIAGNOSIS — Z09 Encounter for follow-up examination after completed treatment for conditions other than malignant neoplasm: Secondary | ICD-10-CM | POA: Diagnosis not present

## 2023-10-17 DIAGNOSIS — K5520 Angiodysplasia of colon without hemorrhage: Secondary | ICD-10-CM | POA: Diagnosis not present

## 2023-10-17 DIAGNOSIS — Z860101 Personal history of adenomatous and serrated colon polyps: Secondary | ICD-10-CM | POA: Diagnosis not present

## 2023-10-17 DIAGNOSIS — Z1211 Encounter for screening for malignant neoplasm of colon: Secondary | ICD-10-CM | POA: Diagnosis not present

## 2023-10-17 LAB — HM COLONOSCOPY

## 2023-11-15 ENCOUNTER — Encounter: Payer: Self-pay | Admitting: Physician Assistant

## 2023-11-15 ENCOUNTER — Ambulatory Visit: Payer: Medicare PPO

## 2023-11-15 VITALS — Ht 63.5 in | Wt 135.0 lb

## 2023-11-15 DIAGNOSIS — Z0000 Encounter for general adult medical examination without abnormal findings: Secondary | ICD-10-CM | POA: Diagnosis not present

## 2023-11-15 NOTE — Progress Notes (Signed)
 Subjective:   Katie Walker is a 68 y.o. female who presents for Medicare Annual (Subsequent) preventive examination.  Visit Complete: Virtual I connected with  Jory Bernheim on 11/15/23 by a audio enabled telemedicine application and verified that I am speaking with the correct person using two identifiers.  Patient Location: Home  Provider Location: Office/Clinic  I discussed the limitations of evaluation and management by telemedicine. The patient expressed understanding and agreed to proceed.  Vital Signs: Because this visit was a virtual/telehealth visit, some criteria may be missing or patient reported. Any vitals not documented were not able to be obtained and vitals that have been documented are patient reported.  Patient Medicare AWV questionnaire was completed by the patient on 11/10/2023; I have confirmed that all information answered by patient is correct and no changes since this date.  Cardiac Risk Factors include: advanced age (>49men, >54 women)     Objective:    Today's Vitals   11/15/23 1100  Weight: 135 lb (61.2 kg)  Height: 5' 3.5 (1.613 m)   Body mass index is 23.54 kg/m.     11/15/2023   11:07 AM 11/14/2022   10:27 AM 11/05/2021   10:17 AM 09/29/2020    8:58 AM 06/24/2014    3:41 PM  Advanced Directives  Does Patient Have a Medical Advance Directive? Yes Yes Yes Yes No   Type of Estate agent of [place];Living will Living will Living will Living will;Healthcare Power of Attorney   Does patient want to make changes to medical advance directive? No - Patient declined No - Patient declined No - Patient declined No - Patient declined   Copy of Healthcare Power of Attorney in Chart?    No - copy requested   Would patient like information on creating a medical advance directive?     Yes - Educational materials given      Data saved with a previous flowsheet row definition    Current Medications (verified) Outpatient Encounter  Medications as of 11/15/2023  Medication Sig   Aflibercept  2 MG/0.05ML SOLN by Intravitreal route.   b complex vitamins tablet Take 1 tablet by mouth daily.   Calcium  Carbonate-Vitamin D  (CALCIUM  + D PO) Take by mouth.   Cyanocobalamin (HM SUPER VITAMIN B12 PO) Take by mouth.   famotidine (PEPCID) 20 MG tablet Take 20 mg by mouth 2 (two) times daily.   Multiple Vitamin (MULTI VITAMIN DAILY PO) Take by mouth.   Omega-3 Fatty Acids (OMEGA 3 PO) Take by mouth.   rosuvastatin  (CRESTOR ) 5 MG tablet TAKE 1 TABLET BY MOUTH EVERY OTHER DAY   No facility-administered encounter medications on file as of 11/15/2023.    Allergies (verified) Patient has no known allergies.   History: Past Medical History:  Diagnosis Date   Arthritis Feb. 8, 2022   Breast disease    Cataracts, bilateral 08/18/2017   Endometrial polyp    GERD (gastroesophageal reflux disease)    Uterine fibroid    Past Surgical History:  Procedure Laterality Date   BREAST BIOPSY Right    BREAST EXCISIONAL BIOPSY Right 1993   BREAST EXCISIONAL BIOPSY Left 1998   BREAST EXCISIONAL BIOPSY Right 2005   COLONOSCOPY     HYSTEROSCOPY     Family History  Problem Relation Age of Onset   Cancer Father 28   Colon cancer Father    Cancer Sister 59 - 29       Thyroid   Cancer Maternal Grandmother  bowel   Heart disease Maternal Grandfather    Breast cancer Paternal Grandmother    Cancer Paternal Grandmother    Colon cancer Paternal Grandfather        bone   Cancer Paternal Grandfather    Social History   Socioeconomic History   Marital status: Married    Spouse name: Threasa Kinch   Number of children: 2   Years of education: 16   Highest education level: Bachelor's degree (e.g., BA, AB, BS)  Occupational History   Occupation: homemaker   Occupation: retired  Tobacco Use   Smoking status: Never   Smokeless tobacco: Never  Vaping Use   Vaping status: Never Used  Substance and Sexual Activity   Alcohol  use: Not Currently    Comment: 1 glass of wine twice a month   Drug use: No   Sexual activity: Yes    Partners: Male    Birth control/protection: Post-menopausal, None    Comment: Partner: Male  Other Topics Concern   Not on file  Social History Narrative   Lives with her husband. Enjoys reading, spending time with grandchildren, traveling and walking.   Goes to the [facility], twice a week and walks a few times a week.    Social Drivers of Corporate investment banker Strain: Low Risk  (11/15/2023)   Overall Financial Resource Strain (CARDIA)    Difficulty of Paying Living Expenses: Not hard at all  Food Insecurity: No Food Insecurity (11/15/2023)   Hunger Vital Sign    Worried About Running Out of Food in the Last Year: Never true    Ran Out of Food in the Last Year: Never true  Transportation Needs: No Transportation Needs (11/15/2023)   PRAPARE - Administrator, Civil Service (Medical): No    Lack of Transportation (Non-Medical): No  Physical Activity: Sufficiently Active (11/15/2023)   Exercise Vital Sign    Days of Exercise per Week: 4 days    Minutes of Exercise per Session: 50 min  Stress: No Stress Concern Present (11/15/2023)   [employer] of Occupational Health - Occupational Stress Questionnaire    Feeling of Stress: Not at all  Social Connections: Socially Integrated (11/15/2023)   Social Connection and Isolation Panel    Frequency of Communication with Friends and Family: More than three times a week    Frequency of Social Gatherings with Friends and Family: Twice a week    Attends Religious Services: More than 4 times per year    Active Member of [employer] or Organizations: Yes    Attends Engineer, structural: More than 4 times per year    Marital Status: Married    Tobacco Counseling Counseling given: Not Answered   Clinical Intake:  Pre-visit preparation completed: Yes  Pain : No/denies pain     BMI - recorded: 23.54 Nutritional  Status: BMI of 19-24  Normal Nutritional Risks: None Diabetes: No  How often do you need to have someone help you when you read instructions, pamphlets, or other written materials from your doctor or pharmacy?: 1 - Never What is the last grade level you completed in school?: 16  Interpreter Needed?: No      Activities of Daily Living    11/15/2023   11:01 AM 11/11/2023   10:27 AM  In your present state of health, do you have any difficulty performing the following activities:  Hearing? 0 0  Vision? 0 0  Difficulty concentrating or making decisions? 0 0  Walking or climbing stairs? 0 0  Dressing or bathing? 0 0  Doing errands, shopping? 0 0  Preparing Food and eating ? N N  Using the Toilet? N N  In the past six months, have you accidently leaked urine? N N  Do you have problems with loss of bowel control? N N  Managing your Medications? N N  Managing your Finances? N N  Housekeeping or managing your Housekeeping? N N    Patient Care Team: Breeback, Jade L, PA-C as PCP - General (Family Medicine) Raj Rankin SAUNDERS, MD as Referring Physician (Ophthalmology) Elnor Mulders, MD as Referring Physician (Ophthalmology)  Indicate any recent Medical Services you may have received from other than [facility] providers in the past year (date may be approximate).     Assessment:   This is a routine wellness examination for Katie Walker.  Hearing/Vision screen No results found.   Goals Addressed             This Visit's Progress    Patient Stated       Patient states she would like to continue to be active.        Depression Screen    11/15/2023   11:06 AM 08/29/2023    6:58 AM 11/14/2022   10:27 AM 11/05/2021   10:18 AM 07/22/2021    9:17 AM 09/29/2020    9:00 AM 05/15/2020    1:29 PM  PHQ 2/9 Scores  PHQ - 2 Score 0 0 0 0 0 0 0  PHQ- 9 Score      0     Fall Risk    11/15/2023   11:07 AM 11/11/2023   10:27 AM 08/29/2023    6:58 AM 11/14/2022   10:27 AM 11/10/2022    8:10 PM  Fall  Risk   Falls in the past year? 0 0 0 0 0  Number falls in past yr: 0  0 0 0  Injury with Fall? 0  0 0 0  Risk for fall due to : No Fall Risks  No Fall Risks No Fall Risks   Follow up Falls evaluation completed  Falls evaluation completed Falls evaluation completed     MEDICARE RISK AT HOME: Medicare Risk at Home Any stairs in or around the home?: No Home free of loose throw rugs in walkways, pet beds, electrical cords, etc?: Yes Adequate lighting in your home to reduce risk of falls?: Yes Life alert?: No Use of a cane, walker or w/c?: No Grab bars in the bathroom?: No Shower chair or bench in shower?: No Elevated toilet seat or a handicapped toilet?: Yes  TIMED UP AND GO:  Was the test performed?  No    Cognitive Function:        11/15/2023   11:10 AM 11/14/2022   10:32 AM 11/05/2021   10:24 AM 09/29/2020    9:11 AM  6CIT Screen  What Year? 0 points 0 points 0 points 0 points  What month? 0 points 0 points 0 points 0 points  What time? 0 points 0 points 0 points 0 points  Count back from 20 0 points 0 points 0 points 0 points  Months in reverse 0 points 0 points 0 points 0 points  Repeat phrase 0 points 0 points 0 points 0 points  Total Score 0 points 0 points 0 points 0 points    Immunizations Immunization History  Administered Date(s) Administered   Fluad Quad(high Dose 65+) 12/01/2020   Influenza,inj,Quad PF,6+ Mos 02/24/2020  PFIZER(Purple Top)SARS-COV-2 Vaccination 06/18/2019, 07/09/2019, 02/04/2020   Pneumococcal Conjugate-13 08/15/2019   Pneumococcal Polysaccharide-23 12/01/2020   Tdap 06/24/2014, 11/12/2014   Zoster Recombinant(Shingrix ) 11/29/2016, 01/30/2017   Zoster, Live 06/24/2014    TDAP status: Up to date  Flu Vaccine status: Declined, Education has been provided regarding the importance of this vaccine but patient still declined. Advised may receive this vaccine at local pharmacy or Health Dept. Aware to provide a copy of the vaccination record if  obtained from local pharmacy or Health Dept. Verbalized acceptance and understanding.  Pneumococcal vaccine status: Up to date  Covid-19 vaccine status: Declined, Education has been provided regarding the importance of this vaccine but patient still declined. Advised may receive this vaccine at local pharmacy or Health Dept.or vaccine clinic. Aware to provide a copy of the vaccination record if obtained from local pharmacy or Health Dept. Verbalized acceptance and understanding.  Qualifies for Shingles Vaccine? Yes   Zostavax completed Yes   Shingrix  Completed?: Yes  Screening Tests Health Maintenance  Topic Date Due   Influenza Vaccine  10/06/2023   COVID-19 Vaccine (4 - 2025-26 season) 11/06/2023   DTaP/Tdap/Td (3 - Td or Tdap) 11/11/2024   Medicare Annual Wellness (AWV)  11/14/2024   MAMMOGRAM  05/30/2025   DEXA SCAN  09/13/2025   Colonoscopy  10/16/2028   Pneumococcal Vaccine: 50+ Years  Completed   Hepatitis C Screening  Completed   Zoster Vaccines- Shingrix   Completed   HPV VACCINES  Aged Out   Meningococcal B Vaccine  Aged Out    Health Maintenance  Health Maintenance Due  Topic Date Due   Influenza Vaccine  10/06/2023   COVID-19 Vaccine (4 - 2025-26 season) 11/06/2023    Colorectal cancer screening: No longer required.   Mammogram status: Completed 05/31/2023. Repeat every year  Bone Density status: Completed 09/14/2022. Results reflect: Bone density results: OSTEOPENIA. Repeat every 3 years.  Lung Cancer Screening: (Low Dose CT Chest recommended if Age 44-80 years, 20 pack-year currently smoking OR have quit w/in 15years.) does not qualify.   Lung Cancer Screening Referral: n/a  Additional Screening:  Hepatitis C Screening: does qualify; Completed 11/30/2017  Vision Screening: Recommended annual ophthalmology exams for early detection of glaucoma and other disorders of the eye. Is the patient up to date with their annual eye exam?  Yes  Who is the provider or  what is the name of the office in which the patient attends annual eye exams? Dr Elnor If pt is not established with a provider, would they like to be referred to a provider to establish care? N/a.   Dental Screening: Recommended annual dental exams for proper oral hygiene   Community Resource Referral / Chronic Care Management: CRR required this visit?  No   CCM required this visit?  No     Plan:     I have personally reviewed and noted the following in the patient's chart:   Medical and social history Use of alcohol, tobacco or illicit drugs  Current medications and supplements including opioid prescriptions. Patient is not currently taking opioid prescriptions. Functional ability and status Nutritional status Physical activity Advanced directives List of other physicians Hospitalizations, surgeries, and ER visits in previous 12 months Vitals Screenings to include cognitive, depression, and falls Referrals and appointments  In addition, I have reviewed and discussed with patient certain preventive protocols, quality metrics, and best practice recommendations. A written personalized care plan for preventive services as well as general preventive health recommendations were provided to patient.  Bonny Jon Mayor, CMA   11/15/2023   After Visit Summary: (MyChart) Due to this being a telephonic visit, the after visit summary with patients personalized plan was offered to patient via MyChart   Nurse Notes:   Katie Walker is a 68 y.o. female patient of Antoniette Vermell CROME, PA-C who had a [employer] Visit today via telephone. Katie Walker is Retired and lives with their spouse. She has 2 children. She reports that she is socially active and does interact with friends/family regularly. She is moderately physically active and enjoys spending time with grandchildren, traveling and walking.

## 2023-11-15 NOTE — Patient Instructions (Signed)
  Katie Walker , Thank you for taking time to come for your Medicare Wellness Visit. I appreciate your ongoing commitment to your health goals. Please review the following plan we discussed and let me know if I can assist you in the future.   These are the goals we discussed:  Goals       Patient Stated (pt-stated)      09/29/2020 AWV Goal: Improved Nutrition/Diet  Patient will verbalize understanding that diet plays an important role in overall health and that a poor diet is a risk factor for many chronic medical conditions.  Over the next year, patient will improve self management of their diet by incorporating better variety. Patient will utilize available community resources to help with food acquisition if needed (ex: food pantries, [address], etc) Patient will work with nutrition specialist if a referral was made       Patient Stated (pt-stated)      11/05/2021 AWV Goal: Exercise for General Health  Patient will verbalize understanding of the benefits of increased physical activity: Exercising regularly is important. It will improve your overall fitness, flexibility, and endurance. Regular exercise also will improve your overall health. It can help you control your weight, reduce stress, and improve your bone density. Over the next year, patient will increase physical activity as tolerated with a goal of at least 150 minutes of moderate physical activity per week.  You can tell that you are exercising at a moderate intensity if your heart starts beating faster and you start breathing faster but can still hold a conversation. Moderate-intensity exercise ideas include: Walking 1 mile (1.6 km) in about 15 minutes Biking Hiking Golfing Dancing Water aerobics Patient will verbalize understanding of everyday activities that increase physical activity by providing examples like the following: Yard work, such as: Company secretary Gardening Washing windows or floors Patient will be able to explain general safety guidelines for exercising:  Before you start a new exercise program, talk with your health care provider. Do not exercise so much that you hurt yourself, feel dizzy, or get very short of breath. Wear comfortable clothes and wear shoes with good support. Drink plenty of water while you exercise to prevent dehydration or heat stroke. Work out until your breathing and your heartbeat get faster.       Patient Stated (pt-stated)      Patient stated that she would like to 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      Patient Stated      Patient states she would like to continue to be active.         This is a list of the screening recommended for you and due dates:  Health Maintenance  Topic Date Due   Flu Shot  10/06/2023   COVID-19 Vaccine (4 - 2025-26 season) 11/06/2023   DTaP/Tdap/Td vaccine (3 - Td or Tdap) 11/11/2024   Medicare Annual Wellness Visit  11/14/2024   Mammogram  05/30/2025   DEXA scan (bone density measurement)  09/13/2025   Colon Cancer Screening  10/16/2028   Pneumococcal Vaccine for age over 45  Completed   Hepatitis C Screening  Completed   Zoster (Shingles) Vaccine  Completed   HPV Vaccine  Aged Out   Meningitis B Vaccine  Aged Out

## 2024-01-23 DIAGNOSIS — H35033 Hypertensive retinopathy, bilateral: Secondary | ICD-10-CM | POA: Diagnosis not present

## 2024-01-23 DIAGNOSIS — H43813 Vitreous degeneration, bilateral: Secondary | ICD-10-CM | POA: Diagnosis not present

## 2024-01-23 DIAGNOSIS — H43393 Other vitreous opacities, bilateral: Secondary | ICD-10-CM | POA: Diagnosis not present

## 2024-01-23 DIAGNOSIS — H34811 Central retinal vein occlusion, right eye, with macular edema: Secondary | ICD-10-CM | POA: Diagnosis not present

## 2024-01-28 ENCOUNTER — Other Ambulatory Visit: Payer: Self-pay | Admitting: Physician Assistant

## 2024-01-28 DIAGNOSIS — E7800 Pure hypercholesterolemia, unspecified: Secondary | ICD-10-CM

## 2024-08-28 ENCOUNTER — Encounter: Admitting: Physician Assistant

## 2024-11-19 ENCOUNTER — Ambulatory Visit
